# Patient Record
Sex: Male | Born: 1960 | Race: Black or African American | Hispanic: No | Marital: Single | Smoking: Former smoker
Health system: Southern US, Community
[De-identification: ages and names within clinical notes are randomized; demographics above are authoritative.]

## PROBLEM LIST (undated history)

## (undated) DIAGNOSIS — D649 Anemia, unspecified: Secondary | ICD-10-CM

---

## 2009-01-31 ENCOUNTER — Emergency Department: Payer: Self-pay | Admitting: Emergency Medicine

## 2011-07-21 ENCOUNTER — Emergency Department: Payer: Self-pay | Admitting: Emergency Medicine

## 2015-01-02 ENCOUNTER — Observation Stay: Payer: Self-pay | Admitting: Internal Medicine

## 2015-01-02 DIAGNOSIS — R079 Chest pain, unspecified: Secondary | ICD-10-CM

## 2015-02-11 NOTE — H&P (Signed)
PATIENT NAME:  Clinton Phillips, Clinton Phillips MR#:  [id number] DATE OF BIRTH:  02-May-1961  DATE OF ADMISSION:  01/02/2015  PRIMARY CARE PHYSICIAN:  None.   CHIEF COMPLAINT:  Chest pain.   HISTORY OF PRESENTING ILLNESS:  This is a 54 year old African-American male patient with history of cocaine, marijuana, and tobacco abuse, who presents to the Emergency Room complaining of chest pain for 1 day. The patient mentions that initially this pain happened acutely at rest. It lasted about 4 to 5 hours and resolved. It does tend to get worse when he takes a deep breath or coughs. Again, it happened today and presented to the Emergency Room. Here, initial EKG showed possible ST elevation in V1, when he was thought to be a candidate for [facility], but repeat EKG immediately showed that this had normalized.   He does not complain of any shortness of breath. No nausea, vomiting, sweating, abdominal pain, or diarrhea. He mentions that he does not use much cocaine, but sometimes uses marijuana laced with cocaine, which he used a week prior. He is not on any home medications.   PAST MEDICAL HISTORY: Cocaine, marijuana and tobacco abuse.   FAMILY HISTORY:  Diabetes. No premature CAD in the family.   SOCIAL HISTORY:  The patient smokes a pack a day. Drinks 1 to 2 beers every other day. Uses marijuana and cocaine. Works as a Gafferhandyman.   CODE STATUS:  Full code.   ALLERGIES:  No known drug allergies.   HOME MEDICATIONS:  None.   REVIEW OF SYSTEMS: CONSTITUTIONAL:  No fever or fatigue.  EYES:  No blurred vision, pain, or redness.  EARS, NOSE, AND THROAT:  No tinnitus, ear pain, or hearing loss.  RESPIRATORY:  No cough, wheeze, or hemoptysis.  CARDIOVASCULAR:  Has chest pain. No orthopnea or edema.  GASTROINTESTINAL:  No nausea, vomiting, diarrhea, or abdominal pain.  GENITOURINARY:  No dysuria, hematuria, or frequency.  ENDOCRINE:  No polyuria, nocturia, or thyroid problems.  HEMATOLOGIC AND  LYMPHATIC:  No anemia or easy bruising or bleeding.  INTEGUMENTARY:  No acne, rash, or lesion.  MUSCULOSKELETAL:  No back pain or arthritis.  NEUROLOGIC:  No focal numbness or weakness.  PSYCHIATRIC:  No anxiety or depression.   PHYSICAL EXAMINATION: VITAL SIGNS:  Temperature 98, pulse 68, blood pressure 136/90, saturating 97% on room air.  GENERAL:  Moderately-built, African-American male patient lying in bed, overall seems comfortable.  PSYCHIATRIC:  Alert and oriented x 3. Mood and affect are appropriate. Judgment is intact.  HEENT:  Atraumatic, normocephalic. Oral mucosa is moist and pink. External ears and nose are normal. No pallor. No icterus. Pupils are bilaterally equal and reactive to light.  NECK:  Supple. No thyromegaly. No palpable lymph nodes. Trachea is midline. No carotid bruits or JVD.  CARDIOVASCULAR:  S1 and S2 without any murmurs. Peripheral pulses are 2+. No edema.  RESPIRATORY:  Normal work of breathing. Clear to auscultation on both sides.  GASTROINTESTINAL:  Soft abdomen, nontender. Bowel sounds present. No organomegaly palpable.  SKIN:  Warm and dry. No petechiae, rash, or ulcers.  MUSCULOSKELETAL:  No joint swelling, redness, or effusion of the large joints. Normal muscle tone. NEUROLOGICAL:  Motor strength is 5/5 in upper and lower extremities. Sensation to fine touch is intact all over.  LYMPHATIC:  No cervical lymphadenopathy.   LABORATORY DATA:  Show glucose of 76, BUN 14, creatinine 1, sodium 139, and potassium of 3.7. AST, ALT, alkaline phosphatase, and bilirubin are normal. Troponin is  less than 0.03. WBC is 6000, hemoglobin 13.2, and platelets are 212,000 with urinalysis showing no bacteria.   Urine drug screen is positive for cannabinoids and opiates.   EKG shows LVH with repolarization change. Initially, EKG had vortex calibration at 20 mm/mV. Repeat EKG showed normalization of his ST elevation in V1, but the vortex calibration was 10 mm/mV.   Chest x-ray  shows nothing acute.   ASSESSMENT AND PLAN: 1.  Chest pain, likely musculoskeletal and pleuritic, but considering the EKG changes, we will admit the patient as requested by the ER physician. We will get 2 more sets of cardiac enzymes. We will schedule for a stress test. We will give the patient nitroglycerin p.r.n. and start him on aspirin. No beta blocker, as he does use cocaine. We will add ibuprofen and Toradol IV in case he has any further pain.  2.  Tobacco abuse. Counseled the patient to quit smoking > 3 min 3.  Deep vein thrombosis prophylaxis with Lovenox.   CODE STATUS:  Full code.   TIME SPENT ON THIS CASE:  40 minutes.   ____________________________ Molinda Bailiff Hollan Philipp, MD srs:nb D: 01/02/2015 02:12:50 ET T: 01/02/2015 02:28:32 ET JOB#: [id number]  cc: Wardell Heath R. Kasiya Burck, MD, <Dictator> Orie Fisherman MD ELECTRONICALLY SIGNED 01/12/2015 13:20

## 2015-02-11 NOTE — Discharge Summary (Signed)
PATIENT NAME:  Clinton Phillips, Clinton Phillips MR#:  [id number] DATE OF BIRTH:  1961-01-07  DATE OF ADMISSION:  01/02/2015 DATE OF DISCHARGE:  01/02/2015  DISCHARGE DIAGNOSES:  Chest pain, probably musculoskeletal; cocaine abuse.   PROCEDURES:  Nuclear stress test on March 22, which was normal.   CODE STATUS:  Full code.   CONSULTATIONS:  Cardiology.   DISCHARGE MEDICATIONS:  Tylenol 325 mg 2 tablets p.o. every 4 hours as needed for pain, aspirin 81 mg enteric-coated p.o. once daily, ibuprofen 800 mg 1 tablet p.o. every 8 hours as needed for moderate-to-severe pain with food.   Recommended to stop using cocaine.   DIET:  Regular.   ACTIVITY:  As tolerated.   FOLLOWUP:  With primary care physician in 1 week.   BRIEF HISTORY AND PHYSICAL:  The patient is a 54 year old African-American male with history of cocaine, marijuana, and tobacco abuse, who came in to the ED with a chief complaint of chest pain. Please review history and physical for details. Initial EKG had possible ST elevation in V1. ER physician thought of transferring the patient to [facility], but immediate repeat EKG has revealed a normal EKG. Please review history and physical for details.   HOSPITAL COURSE BASED ON PROBLEM:  Chest pain, most likely musculoskeletal as it is reproducible, but considering the EKG changes, the patient was admitted to the hospital for overnight observation. Two more sets of cardiac enzymes were done, and acute MI was ruled out. The patient had nuclear stress test done, which was evidently normal. Beta blocker was not given, as the patient uses cocaine. The patient was provided with aspirin, and nitroglycerin was ordered for as-needed basis for his chest pain. Ibuprofen and Toradol IV was added to his regimen, as it was most likely musculoskeletal chest pain. On further examination, his chest pain was reproducible, which has resolved with ibuprofen. Stress test being normal, cardiology has recommended  discharging the patient from cardiology standpoint.   The patient was recommended to stop using illicit drugs, cocaine and marijuana. Counseled to quit smoking and counseling was provided for 3 to 5 minutes. The patient was recommended to use nicotine patches, which are over-the-counter. The patient was discharged under satisfactory condition.   PHYSICAL EXAMINATION: VITAL SIGNS:  Temperature 97.9, pulse 64, respirations 20, blood pressure 129/80, pulse oximetry 96% on 2 liters.  GENERAL APPEARANCE:  Not in acute distress. Moderately built and nourished.  HEENT:  Normocephalic, atraumatic. Pupils are equal and reactive to light and accommodation. No scleral icterus. No conjunctival injection. No sinus tenderness. No postnasal drip. Moist mucous membranes.  NECK:  Supple. No lymphadenopathy. No thyromegaly. No masses.  LUNGS:  Clear to auscultation bilaterally. No accessory muscle use. Positive anterior chest wall tenderness on palpation on the left side of the chest, which is reproducible.  CARDIAC:  S1 and S2, normal. Regular rate and rhythm. No murmurs.  GASTROINTESTINAL:  Soft. Bowel sounds are positive in all 4 quadrants. Nontender, nondistended. No masses.  NEUROLOGIC:  Awake, alert, and oriented x 3. Cranial nerves II through XII are grossly intact. Motor and sensory are intact. Reflexes are 2+.  EXTREMITIES:  No edema. No cyanosis. No clubbing.  SKIN:  Warm to touch. Normal turgor. No rashes. No lesions.   LABORATORY AND IMAGING STUDIES:  Troponin less than 0.03 x 3. CK and CPK-MB were normal. Urine drug screen was positive for cannabinoids and opiates. WBC is 6.0, hemoglobin 13.2, hematocrit 39.6, platelets 212,000. UA:  Nitrite and leukocyte esterase are negative. BMP  is normal. LFTs are normal. Nuclear stress test performed on March 22 by cardiology.  Pharmacological myocardial perfusion study with no significant ischemia, estimated EF of 67%. Left ventricular global function was normal. No  significant wall motion abnormalities. Overall, low-risk scan.   The patient was discharged home under satisfactory condition. All of his questions were answered.   TOTAL TIME SPENT ON DISCHARGE AND COORDINATION OF CARE:  45 minutes.   ____________________________ Ramonita LabAruna Lusia Greis, MD ag:nb D: 01/03/2015 15:43:08 ET T: 01/04/2015 05:07:49 ET JOB#: [id number]  cc: Ramonita LabAruna Emmitt Matthews, MD, <Dictator> Antonieta Ibaimothy J. Gollan, MD Primary care physician   Ramonita LabARUNA Chava Dulac MD ELECTRONICALLY SIGNED 01/12/2015 13:16

## 2016-11-02 ENCOUNTER — Emergency Department: Payer: No Typology Code available for payment source

## 2016-11-02 ENCOUNTER — Emergency Department
Admission: EM | Admit: 2016-11-02 | Discharge: 2016-11-02 | Disposition: A | Discharge status: Home / Self Care | Payer: No Typology Code available for payment source | Attending: Student in an Organized Health Care Education/Training Program | Admitting: Student in an Organized Health Care Education/Training Program

## 2016-11-02 ENCOUNTER — Encounter: Payer: Self-pay | Admitting: Emergency Medicine

## 2016-11-02 DIAGNOSIS — Y999 Unspecified external cause status: Secondary | ICD-10-CM | POA: Insufficient documentation

## 2016-11-02 DIAGNOSIS — S29012A Strain of muscle and tendon of back wall of thorax, initial encounter: Secondary | ICD-10-CM | POA: Diagnosis not present

## 2016-11-02 DIAGNOSIS — Y939 Activity, unspecified: Secondary | ICD-10-CM | POA: Insufficient documentation

## 2016-11-02 DIAGNOSIS — Y92410 Unspecified street and highway as the place of occurrence of the external cause: Secondary | ICD-10-CM | POA: Insufficient documentation

## 2016-11-02 DIAGNOSIS — F17210 Nicotine dependence, cigarettes, uncomplicated: Secondary | ICD-10-CM | POA: Diagnosis not present

## 2016-11-02 DIAGNOSIS — S299XXA Unspecified injury of thorax, initial encounter: Secondary | ICD-10-CM | POA: Diagnosis present

## 2016-11-02 DIAGNOSIS — T148XXA Other injury of unspecified body region, initial encounter: Secondary | ICD-10-CM

## 2016-11-02 HISTORY — DX: Anemia, unspecified: D64.9

## 2016-11-02 MED ORDER — KETOROLAC TROMETHAMINE 60 MG/2ML IM SOLN
60.0000 mg | Freq: Once | INTRAMUSCULAR | Status: AC
  Administered 2016-11-02: 60 mg via INTRAMUSCULAR
  Filled 2016-11-02: qty 2

## 2016-11-02 MED ORDER — MELOXICAM 15 MG PO TABS: 15.0000 mg | ORAL_TABLET | Freq: Every day | ORAL | 0 refills | Status: AC

## 2016-11-02 MED ORDER — LIDOCAINE 5 % EX PTCH: 1.0000 | MEDICATED_PATCH | CUTANEOUS | 0 refills | Status: DC
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
## 2017-08-05 ENCOUNTER — Emergency Department: Payer: No Typology Code available for payment source

## 2017-08-05 ENCOUNTER — Emergency Department
Admission: EM | Admit: 2017-08-05 | Discharge: 2017-08-05 | Disposition: A | Discharge status: Home / Self Care | Payer: No Typology Code available for payment source | Attending: Emergency Medicine | Admitting: Emergency Medicine

## 2017-08-05 DIAGNOSIS — M7918 Myalgia, other site: Secondary | ICD-10-CM | POA: Insufficient documentation

## 2017-08-05 DIAGNOSIS — F17210 Nicotine dependence, cigarettes, uncomplicated: Secondary | ICD-10-CM | POA: Diagnosis not present

## 2017-08-05 DIAGNOSIS — R0789 Other chest pain: Secondary | ICD-10-CM | POA: Diagnosis not present

## 2017-08-05 MED ORDER — KETOROLAC TROMETHAMINE 30 MG/ML IJ SOLN
30.0000 mg | Freq: Once | INTRAMUSCULAR | Status: AC
  Administered 2017-08-05: 30 mg via INTRAMUSCULAR
  Filled 2017-08-05: qty 1

## 2017-08-05 MED ORDER — CYCLOBENZAPRINE HCL 5 MG PO TABS: 5.0000 mg | ORAL_TABLET | Freq: Three times a day (TID) | ORAL | 0 refills | Status: DC | PRN

## 2017-08-05 MED ORDER — IBUPROFEN 800 MG PO TABS: 800.0000 mg | ORAL_TABLET | Freq: Three times a day (TID) | ORAL | 0 refills | Status: DC | PRN
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
## 2017-08-05 NOTE — Discharge Instructions (Signed)
Please ice just 20 minutes every hour, take anti-inflammatory medications and muscle relaxers as needed.  Avoid any heavy lifting pushing or pulling for the next 24-48 hours.  Follow-up with orthopedics if no improvement in 1 week.
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
## 2017-08-05 NOTE — ED Notes (Addendum)
Dr. Don PerkingVeronese states no blood work needed at this time.

## 2017-08-06 ENCOUNTER — Other Ambulatory Visit: Payer: Self-pay

## 2017-08-08 ENCOUNTER — Emergency Department
Admission: EM | Admit: 2017-08-08 | Discharge: 2017-08-08 | Disposition: A | Discharge status: Home / Self Care | Payer: No Typology Code available for payment source | Attending: Emergency Medicine | Admitting: Emergency Medicine

## 2017-08-08 ENCOUNTER — Encounter: Payer: Self-pay | Admitting: Emergency Medicine

## 2017-08-08 ENCOUNTER — Emergency Department: Payer: No Typology Code available for payment source

## 2017-08-08 DIAGNOSIS — F17210 Nicotine dependence, cigarettes, uncomplicated: Secondary | ICD-10-CM | POA: Insufficient documentation

## 2017-08-08 DIAGNOSIS — M546 Pain in thoracic spine: Secondary | ICD-10-CM | POA: Insufficient documentation

## 2017-08-08 DIAGNOSIS — S299XXD Unspecified injury of thorax, subsequent encounter: Secondary | ICD-10-CM | POA: Diagnosis present

## 2017-08-08 MED ORDER — KETOROLAC TROMETHAMINE 30 MG/ML IJ SOLN
30.0000 mg | Freq: Once | INTRAMUSCULAR | Status: AC
  Administered 2017-08-08: 30 mg via INTRAMUSCULAR
  Filled 2017-08-08: qty 1

## 2017-08-08 MED ORDER — MELOXICAM 15 MG PO TABS: 15.0000 mg | ORAL_TABLET | Freq: Every day | ORAL | 2 refills | Status: AC
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
## 2017-08-08 NOTE — Discharge Instructions (Signed)
Follow [facility] with [facility] if any continued problems  Ice or heat to back as needed Stop ibuprofen.  Start mobic today and take one a day with food

## 2017-08-08 NOTE — ED Notes (Signed)
Pt able to ambulate at time of discharge. NAD noted at this time.
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
## 2020-08-15 ENCOUNTER — Emergency Department
Admission: EM | Admit: 2020-08-15 | Discharge: 2020-08-15 | Disposition: A | Discharge status: Home / Self Care | Payer: Self-pay | Attending: Emergency Medicine | Admitting: Emergency Medicine

## 2020-08-15 ENCOUNTER — Other Ambulatory Visit: Payer: Self-pay

## 2020-08-15 ENCOUNTER — Encounter: Payer: Self-pay | Admitting: Emergency Medicine

## 2020-08-15 DIAGNOSIS — L03213 Periorbital cellulitis: Secondary | ICD-10-CM | POA: Insufficient documentation

## 2020-08-15 DIAGNOSIS — F17210 Nicotine dependence, cigarettes, uncomplicated: Secondary | ICD-10-CM | POA: Insufficient documentation

## 2020-08-15 MED ORDER — AMOXICILLIN-POT CLAVULANATE 875-125 MG PO TABS: 1.0000 | ORAL_TABLET | Freq: Two times a day (BID) | ORAL | 0 refills | Status: AC

## 2020-08-15 NOTE — Discharge Instructions (Addendum)
Take Augmentin twice daily for the next ten days.  Use warm compresses four times daily for the next seven days.  Please follow up with Ophthalmology if symptoms do not improve.

## 2020-08-15 NOTE — ED Triage Notes (Signed)
Patient states that his right eye has been bothering him times one week. Patient states that he thinks that he got something in it. Patient states that the discomfort has become worse.

## 2020-08-15 NOTE — ED Notes (Signed)
Pt has swelling to right eye that started a few days ago. Pt states having slight pain when he touches it or looks a certain way. Pt denies fevers or any insect bites.
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
## 2021-01-05 ENCOUNTER — Emergency Department
Admission: EM | Admit: 2021-01-05 | Discharge: 2021-01-05 | Disposition: A | Discharge status: Home / Self Care | Payer: Self-pay | Attending: Emergency Medicine | Admitting: Emergency Medicine

## 2021-01-05 ENCOUNTER — Emergency Department: Payer: Self-pay

## 2021-01-05 ENCOUNTER — Other Ambulatory Visit: Payer: Self-pay

## 2021-01-05 DIAGNOSIS — R112 Nausea with vomiting, unspecified: Secondary | ICD-10-CM | POA: Insufficient documentation

## 2021-01-05 DIAGNOSIS — Q613 Polycystic kidney, unspecified: Secondary | ICD-10-CM

## 2021-01-05 DIAGNOSIS — F17210 Nicotine dependence, cigarettes, uncomplicated: Secondary | ICD-10-CM | POA: Insufficient documentation

## 2021-01-05 DIAGNOSIS — R310 Gross hematuria: Secondary | ICD-10-CM

## 2021-01-05 LAB — URINALYSIS, COMPLETE (UACMP) WITH MICROSCOPIC
Bacteria, UA: NONE SEEN
Bilirubin Urine: NEGATIVE
Glucose, UA: NEGATIVE mg/dL
Ketones, ur: NEGATIVE mg/dL
Leukocytes,Ua: NEGATIVE
Nitrite: NEGATIVE
Protein, ur: 30 mg/dL — AB
RBC / HPF: >50 RBC/hpf — ABNORMAL HIGH (ref 0–5)
Specific Gravity, Urine: 1.021 (ref 1.005–1.030)
Squamous Epithelial / LPF: NONE SEEN (ref 0–5)
WBC, UA: NONE SEEN WBC/hpf (ref 0–5)
pH: 5 (ref 5.0–8.0)

## 2021-01-05 LAB — CBC
HCT: 39.3 % (ref 39.0–52.0)
Hemoglobin: 13.5 g/dL (ref 13.0–17.0)
MCH: 33.3 pg (ref 26.0–34.0)
MCHC: 34.4 g/dL (ref 30.0–36.0)
MCV: 97 fL (ref 80.0–100.0)
Platelets: 243 10*3/uL (ref 150–400)
RBC: 4.05 MIL/uL — ABNORMAL LOW (ref 4.22–5.81)
RDW: 12.5 % (ref 11.5–15.5)
WBC: 4.5 10*3/uL (ref 4.0–10.5)
nRBC: 0 % (ref 0.0–0.2)

## 2021-01-05 LAB — BASIC METABOLIC PANEL
Anion gap: 7 (ref 5–15)
BUN: 13 mg/dL (ref 8–23)
CO2: 27 mmol/L (ref 22–32)
Calcium: 9 mg/dL (ref 8.9–10.3)
Chloride: 105 mmol/L (ref 98–111)
Creatinine, Ser: 1.23 mg/dL (ref 0.61–1.24)
GFR, Estimated: >60 mL/min (ref >60)
Glucose, Bld: 114 mg/dL — ABNORMAL HIGH (ref 70–99)
Potassium: 4 mmol/L (ref 3.5–5.1)
Sodium: 139 mmol/L (ref 135–145)

## 2021-01-05 MED ORDER — MORPHINE SULFATE (PF) 4 MG/ML IV SOLN
6.0000 mg | Freq: Once | INTRAVENOUS | Status: AC
  Administered 2021-01-05: 6 mg via INTRAVENOUS
  Filled 2021-01-05: qty 2

## 2021-01-05 MED ORDER — ONDANSETRON 4 MG PO TBDP: 4.0000 mg | ORAL_TABLET | Freq: Three times a day (TID) | ORAL | 0 refills | Status: DC | PRN

## 2021-01-05 MED ORDER — ONDANSETRON HCL 4 MG/2ML IJ SOLN
4.0000 mg | Freq: Once | INTRAMUSCULAR | Status: AC
  Administered 2021-01-05: 4 mg via INTRAVENOUS
  Filled 2021-01-05: qty 2

## 2021-01-05 MED ORDER — KETOROLAC TROMETHAMINE 30 MG/ML IJ SOLN
30.0000 mg | Freq: Once | INTRAMUSCULAR | Status: AC
  Administered 2021-01-05: 30 mg via INTRAVENOUS
  Filled 2021-01-05: qty 1

## 2021-01-05 MED ORDER — LACTATED RINGERS IV BOLUS
1000.0000 mL | Freq: Once | INTRAVENOUS | Status: AC
  Administered 2021-01-05: 1000 mL via INTRAVENOUS

## 2021-01-05 MED ORDER — OXYCODONE-ACETAMINOPHEN 5-325 MG PO TABS: 1.0000 | ORAL_TABLET | Freq: Four times a day (QID) | ORAL | 0 refills | Status: AC | PRN

## 2021-01-05 NOTE — ED Triage Notes (Signed)
Pt comes pov with right sided flank pain. Family hx of stones. Pain started today. Pt states some hematuria.

## 2021-01-05 NOTE — Discharge Instructions (Signed)
It is very important that you follow-up with a regular doctor for following up your kidney disease and findings on CT.  It is probably easiest to [facility] or [facility].  I provided these numbers.  Call to set up an appointment within the next several weeks.  Avoid taking ibuprofen, Aleve, or other medications that could harm your kidneys.  Drink at least 6 to 8 glasses of water daily.

## 2021-01-05 NOTE — ED Provider Notes (Signed)
[facility] Emergency Department Provider Note  ____________________________________________   Event Date/Time   First MD Initiated Contact with Patient 01/05/21 1815     (approximate)  I have reviewed the triage vital signs and the nursing notes.   HISTORY  Chief Complaint Flank Pain    HPI Clinton HuskJoseph Phillips is a 60 y.o. male  Here with flank pain.  Patient states that his pain began fairly acutely this morning.  He states he was in his usual state of health yesterday, until he developed acute onset of severe right flank pain.  He describes the pain as aching, gnawing, radiating down towards his right lower quadrant.  He had associated grossly bloody urine this morning.  He states he then passed a small amount of sediment when he peed again.  He did not have any blood in his most recent urination, but he continues to have 6 out of 10 aching pain.  The pain is intermittently worse randomly.  It does seem to improve with some position changes.  No specific alleviating factors.  No fevers or chills.  He has had nausea and 2 episodes of emesis.  No other complaints.  No personal history of kidney stones, but does have a family history.       Past Medical History:  Diagnosis Date  . Anemia     There are no problems to display for this patient.   History reviewed. No pertinent surgical history.  Prior to Admission medications   Medication Sig Start Date End Date Taking? Authorizing Provider  ondansetron (ZOFRAN ODT) 4 MG disintegrating tablet Take 1 tablet (4 mg total) by mouth every 8 (eight) hours as needed for nausea or vomiting. 01/05/21  Yes Shaune PollackIsaacs, Shanedra Lave, MD  oxyCODONE-acetaminophen (PERCOCET) 5-325 MG tablet Take 1-2 tablets by mouth every 6 (six) hours as needed for moderate pain or severe pain (no more than 6 tabs daily). 01/05/21 01/05/22 Yes Shaune PollackIsaacs, Kristianna Saperstein, MD  cyclobenzaprine (FLEXERIL) 5 MG tablet Take 1-2 tablets (5-10 mg total) by mouth 3  (three) times daily as needed for muscle spasms. 08/05/17   Evon SlackGaines, Thomas C, PA-C    Allergies Patient has no known allergies.  History reviewed. No pertinent family history.  Social History Social History   Tobacco Use  . Smoking status: Current Every Day Smoker    Packs/day: 0.50    Types: Cigarettes  . Smokeless tobacco: Never Used  Substance Use Topics  . Alcohol use: Yes    Comment: occasionally  . Drug use: Yes    Types: Marijuana    Comment: occ    Review of Systems  Review of Systems  Constitutional: Positive for fatigue. Negative for chills and fever.  HENT: Negative for sore throat.   Respiratory: Negative for shortness of breath.   Cardiovascular: Negative for chest pain.  Gastrointestinal: Positive for nausea and vomiting. Negative for abdominal pain.  Genitourinary: Positive for frequency and hematuria. Negative for flank pain.  Musculoskeletal: Negative for neck pain.  Skin: Negative for rash and wound.  Allergic/Immunologic: Negative for immunocompromised state.  Neurological: Negative for weakness and numbness.  Hematological: Does not bruise/bleed easily.  All other systems reviewed and are negative.    ____________________________________________  PHYSICAL EXAM:      VITAL SIGNS: ED Triage Vitals  Enc Vitals Group     BP 01/05/21 1747 (!) 148/93     Pulse Rate 01/05/21 1747 80     Resp 01/05/21 1747 18     Temp 01/05/21 1747  98.3 F (36.8 C)     Temp Source 01/05/21 1747 Oral     SpO2 01/05/21 1747 96 %     Weight 01/05/21 1728 205 lb (93 kg)     Height 01/05/21 1728 6\' 1"  (1.854 m)     Head Circumference --      Peak Flow --      Pain Score 01/05/21 1727 8     Pain Loc --      Pain Edu? --      Excl. in GC? --      Physical Exam Vitals and nursing note reviewed.  Constitutional:      General: He is not in acute distress.    Appearance: He is well-developed.  HENT:     Head: Normocephalic and atraumatic.  Eyes:      Conjunctiva/sclera: Conjunctivae normal.  Cardiovascular:     Rate and Rhythm: Normal rate and regular rhythm.     Heart sounds: Normal heart sounds. No murmur heard. No friction rub.  Pulmonary:     Effort: Pulmonary effort is normal. No respiratory distress.     Breath sounds: Normal breath sounds. No wheezing or rales.  Abdominal:     General: There is no distension.     Palpations: Abdomen is soft.     Tenderness: There is no abdominal tenderness.  Musculoskeletal:     Cervical back: Neck supple.  Skin:    General: Skin is warm.     Capillary Refill: Capillary refill takes less than 2 seconds.  Neurological:     Mental Status: He is alert and oriented to person, place, and time.     Motor: No abnormal muscle tone.       ____________________________________________   LABS (all labs ordered are listed, but only abnormal results are displayed)  Labs Reviewed  URINALYSIS, COMPLETE (UACMP) WITH MICROSCOPIC - Abnormal; Notable for the following components:      Result Value   Color, Urine AMBER (*)    APPearance CLOUDY (*)    Hgb urine dipstick LARGE (*)    Protein, ur 30 (*)    RBC / HPF >50 (*)    All other components within normal limits  BASIC METABOLIC PANEL - Abnormal; Notable for the following components:   Glucose, Bld 114 (*)    All other components within normal limits  CBC - Abnormal; Notable for the following components:   RBC 4.05 (*)    All other components within normal limits    ____________________________________________  EKG:  ________________________________________  RADIOLOGY All imaging, including plain films, CT scans, and ultrasounds, independently reviewed by me, and interpretations confirmed via formal radiology reads.  ED MD interpretation:   CT stone: Polycystic kidney disease noted, possible liver cyst, no obstructing stone  Official radiology report(s): CT Renal Stone Study  Result Date: 01/05/2021 CLINICAL DATA:  Bilateral flank  pain. EXAM: CT ABDOMEN AND PELVIS WITHOUT CONTRAST TECHNIQUE: Multidetector CT imaging of the abdomen and pelvis was performed following the standard protocol without IV contrast. COMPARISON:  None. FINDINGS: Lower chest: Dependent atelectasis in both lung bases. No pleural fluid. Heart is normal in size. Hepatobiliary: 3.5 cm homogeneous complex cystic lesion abuts the anterior left lobe of the liver just to the left of midline in the upper abdomen, unclear if this is an exophytic hepatic cyst or extrahepatic lesion. There is no other focal hepatic lesion. Gallbladder physiologically distended, no calcified stone. No biliary dilatation. Pancreas: No ductal dilatation or inflammation. Spleen: Normal in  size without focal abnormality. Adrenals/Urinary Tract: No adrenal nodule. Polycystic disease of both kidneys with near complete renal parenchymal replacement with innumerable cysts. Many of these cysts are hyperdense or complex. This includes multiple adjacent cyst versus complex cyst in the lower right kidney that has mild contour deformity, measuring approximately 6.8 cm transverse. There is a thick-walled calcified lesion in the anterior upper right kidney measuring 2.1 cm. No discrete renal calculi. There is no hydronephrosis. No ureteral stone. Urinary bladder is near completely empty. No bladder stone. No urethral stone is visualized. Stomach/Bowel: Ingested material distends the stomach. There is a 3.5 cm homogeneous complex cystic lesion abutting the greater curvature of the stomach as well as the anterior left lobe of the liver, etiology indeterminate. No underlying mass effect no small bowel obstruction or inflammation. Normal air-filled appendix courses into the central mid abdomen. Mild descending and sigmoid colonic diverticulosis. No evidence of diverticulitis. Vascular/Lymphatic: Aortic atherosclerosis. Bi-iliac atherosclerosis and tortuosity. There are few prominent bilateral inguinal nodes, largest  on the left measuring 9 mm. Reproductive: Prostate is unremarkable. Other: No free air, free fluid, or intra-abdominal fluid collection. Musculoskeletal: Lower lumbar facet arthropathy. There are no acute or suspicious osseous abnormalities. IMPRESSION: 1. No renal stones or obstructive uropathy. 2. Polycystic disease of both kidneys with near complete renal parenchymal replacement with innumerable cysts. Many of these cysts are hyperdense or complex. This includes multiple adjacent cysts versus complex cyst in the lower right kidney that has mild contour deformity. There are no prior exams to assess for stability. Recommend further evaluation with renal protocol MRI on an elective basis. 3. A 3.5 cm homogeneous complex cystic lesion abutting the anterior left lobe of the liver and greater curvature of the stomach is indeterminate. This may represent an exophytic hepatic cyst or an enteric duplication cyst, however is nonspecific. This can also be followed on MRI. 4. Mild colonic diverticulosis without diverticulitis. Aortic Atherosclerosis (ICD10-I70.0). Electronically Signed   By: Narda Rutherford M.D.   On: 01/05/2021 20:16    ____________________________________________  PROCEDURES   Procedure(s) performed (including Critical Care):  Procedures  ____________________________________________  INITIAL IMPRESSION / MDM / ASSESSMENT AND PLAN / ED COURSE  As part of my medical decision making, I reviewed the following data within the electronic MEDICAL RECORD NUMBER Nursing notes reviewed and incorporated, Old chart reviewed, Notes from prior ED visits, and [place] Controlled Substance Database       *Clinton Phillips was evaluated in Emergency Department on 01/05/2021 for the symptoms described in the history of present illness. He was evaluated in the context of the global COVID-19 pandemic, which necessitated consideration that the patient might be at risk for infection with the SARS-CoV-2 virus that  causes COVID-19. Institutional protocols and algorithms that pertain to the evaluation of patients at risk for COVID-19 are in a state of rapid change based on information released by regulatory bodies including the CDC and federal and state organizations. These policies and algorithms were followed during the patient's care in the ED.  Some ED evaluations and interventions may be delayed as a result of limited staffing during the pandemic.*     Medical Decision Making: Very pleasant 60 year old male here with right flank pain.  This is essentially resolved after passage of what sounds like a possible stone.  He does have hematuria noted on UA.  Renal function is normal.  No evidence of pyuria, fever, leukocytosis, or signs of superimposed infection.  Of note, on his CT stone, he does have what  appears to be polycystic kidney and possible liver disease.  Creatinine is 1.2.  Patient does have a family history of kidney disease although he denies known history of polycystic disease.  I discussed the CT findings at length with him, as well as need for outpatient follow-up for monitoring of his kidney function as well as further evaluation.  Discussed that failure to do so could result in worsening liver and kidney disease with subsequent renal failure.  He understands.  Will provide him with both primary as well as nephrology follow-up.  Return precautions given.  Advised him to avoid NSAIDs as an outpatient.  ____________________________________________  FINAL CLINICAL IMPRESSION(S) / ED DIAGNOSES  Final diagnoses:  Gross hematuria  Polycystic kidney disease     MEDICATIONS GIVEN DURING THIS VISIT:  Medications  lactated ringers bolus 1,000 mL (0 mLs Intravenous Stopped 01/05/21 2002)  morphine 4 MG/ML injection 6 mg (6 mg Intravenous Given 01/05/21 1857)  ondansetron (ZOFRAN) injection 4 mg (4 mg Intravenous Given 01/05/21 1848)  ketorolac (TORADOL) 30 MG/ML injection 30 mg (30 mg Intravenous Given  01/05/21 1849)     ED Discharge Orders         Ordered    oxyCODONE-acetaminophen (PERCOCET) 5-325 MG tablet  Every 6 hours PRN        01/05/21 2058    ondansetron (ZOFRAN ODT) 4 MG disintegrating tablet  Every 8 hours PRN        01/05/21 2058           Note:  This document was prepared using Dragon voice recognition software and may include unintentional dictation errors.   Shaune Pollack, MD 01/05/21 2152

## 2021-01-10 ENCOUNTER — Telehealth: Payer: Self-pay

## 2021-01-10 NOTE — Telephone Encounter (Signed)
Received ED referral and called pt. He is not interested in becoming a pt here at the moment.

## 2021-07-20 ENCOUNTER — Emergency Department
Admission: EM | Admit: 2021-07-20 | Discharge: 2021-07-20 | Disposition: A | Discharge status: Home / Self Care | Payer: Self-pay | Attending: Emergency Medicine | Admitting: Emergency Medicine

## 2021-07-20 DIAGNOSIS — M25519 Pain in unspecified shoulder: Secondary | ICD-10-CM | POA: Insufficient documentation

## 2021-07-20 DIAGNOSIS — Z5321 Procedure and treatment not carried out due to patient leaving prior to being seen by health care provider: Secondary | ICD-10-CM | POA: Insufficient documentation

## 2021-07-20 NOTE — ED Notes (Signed)
After vitals, patient asked to go get his phone out of his truck. Patient seen getting in his truck and driving away

## 2021-12-18 ENCOUNTER — Other Ambulatory Visit: Payer: Self-pay

## 2021-12-18 ENCOUNTER — Encounter: Payer: Self-pay | Admitting: Nurse Practitioner

## 2021-12-18 ENCOUNTER — Ambulatory Visit: Payer: Self-pay | Admitting: Nurse Practitioner

## 2021-12-18 DIAGNOSIS — Z113 Encounter for screening for infections with a predominantly sexual mode of transmission: Secondary | ICD-10-CM

## 2021-12-18 DIAGNOSIS — A599 Trichomoniasis, unspecified: Secondary | ICD-10-CM

## 2021-12-18 LAB — HM HIV SCREENING LAB: HM HIV Screening: NEGATIVE

## 2021-12-18 LAB — GRAM STAIN

## 2021-12-18 LAB — HEPATITIS B SURFACE ANTIGEN: Hepatitis B Surface Ag: NONREACTIVE

## 2021-12-18 LAB — HM HEPATITIS C SCREENING LAB: HM Hepatitis Screen: NEGATIVE

## 2021-12-18 MED ORDER — METRONIDAZOLE 500 MG PO TABS: 500.0000 mg | ORAL_TABLET | Freq: Two times a day (BID) | ORAL | 0 refills | Status: AC

## 2021-12-18 NOTE — Progress Notes (Signed)
Patient seen today for STD testing. Gram stain reviewed. Medication dispensed. Providers orders completed.  Patient tx for trich exposure.  ?

## 2021-12-19 NOTE — Progress Notes (Signed)
[facility] Department ?STI clinic/screening visit ? ?Subjective:  ?Clinton Phillips is a 61 y.o. male being seen today for an STI screening visit. The patient reports they do not have symptoms.   ? ?Patient has the following medical conditions:  There are no problems to display for this patient. ? ? ? ?Chief Complaint  ?Patient presents with  ? SEXUALLY TRANSMITTED DISEASE  ?  screening  ? ? ?HPI ? ?Patient reports to clinic today for an STD screening. Patient reports being a contact to [place].   ? ?Does the patient or their partner desires a pregnancy in the next year? No ? ?Screening for MPX risk: ?Does the patient have an unexplained rash? No ?Is the patient MSM? No ?Does the patient endorse multiple sex partners or anonymous sex partners? No ?Did the patient have close or sexual contact with a person diagnosed with MPX? No ?Has the patient traveled outside the [place] where MPX is endemic? No ?Is there a high clinical suspicion for MPX-- evidenced by one of the following No ? -Unlikely to be chickenpox ? -Lymphadenopathy ? -Rash that present in same phase of evolution on any given body part ? ? ?See flowsheet for further details and programmatic requirements.  ? ? ?The following portions of the patient's history were reviewed and updated as appropriate: allergies, current medications, past medical history, past social history, past surgical history and problem list. ? ?Objective:  ?There were no vitals filed for this visit. ? ?Physical Exam ?Constitutional:   ?   Appearance: Normal appearance.  ?HENT:  ?   Head: Normocephalic.  ?   Right Ear: External ear normal.  ?   Left Ear: External ear normal.  ?   Nose: Nose normal.  ?   Mouth/Throat:  ?   Mouth: Mucous membranes are moist.  ?   Comments: Broken teeth noted.  ?Pulmonary:  ?   Effort: Pulmonary effort is normal.  ?Abdominal:  ?   General: Abdomen is flat.  ?   Palpations: Abdomen is soft.  ?Genitourinary: ?   Penis: Circumcised.   ?   Comments: Pubic  area without nits, lice, hair loss, edema, erythema, lesions and inguinal adenopathy. ?Penis without rash, lesions and discharge at meatus. ?Testicles descended bilaterally,nt, no masses or edema.  ?Musculoskeletal:  ?   Cervical back: Full passive range of motion without pain, normal range of motion and neck supple.  ?Skin: ?   General: Skin is warm and dry.  ?Neurological:  ?   Mental Status: He is alert and oriented to person, place, and time.  ?Psychiatric:     ?   Attention and Perception: Attention normal.     ?   Mood and Affect: Mood normal.     ?   Speech: Speech normal.     ?   Behavior: Behavior is cooperative.  ? ? ? ? ?Assessment and Plan:  ?Clinton Phillips is a 61 y.o. male presenting to the [facility] Department for STI screening ? ?1. Screening examination for venereal disease ?-61 year old male in clinic today as a contact to [place].  Desires STD screening.  ?-Patient does not have STI symptoms ?Patient accepted all screenings including  oral GC and bloodwork for HIV/RPR, HBV/HCV ?Patient meets criteria for HepB screening? Yes. Ordered? Yes ?Patient meets criteria for HepC screening? Yes. Ordered? Yes ?Recommended condom use with all sex ?Discussed importance of condom use for STI prevent ? ?Treat gram stain per standing order ?Discussed time line  for [facility] results and that patient will be called with positive results and encouraged patient to call if he had not heard in 2 weeks ?Recommended returning for continued or worsening symptoms.   ? ?- HIV/HCV [place] [facility] ?- Syphilis Serology, [facility] ?- HBV Antigen/[facility] ?- Gram stain ?- Gonococcus culture ?- Gonococcus culture ? ?2. Trichimoniasis ?-Treat Patient today as a contact to [place]. ?- metroNIDAZOLE (FLAGYL) 500 MG tablet; Take 1 tablet (500 mg total) by mouth 2 (two) times daily for 7 days.  Dispense: 14 tablet; Refill: 0 ? ? ? ? ?Return if symptoms worsen or fail to improve. ? ? ? ?Gregary Cromer, FNP ? ?

## 2021-12-22 LAB — GONOCOCCUS CULTURE

## 2023-03-18 ENCOUNTER — Emergency Department
Admission: EM | Admit: 2023-03-18 | Discharge: 2023-03-18 | Disposition: A | Discharge status: Home / Self Care | Payer: Medicaid Other | Attending: Emergency Medicine | Admitting: Emergency Medicine

## 2023-03-18 ENCOUNTER — Other Ambulatory Visit: Payer: Self-pay

## 2023-03-18 DIAGNOSIS — Y92410 Unspecified street and highway as the place of occurrence of the external cause: Secondary | ICD-10-CM | POA: Diagnosis not present

## 2023-03-18 DIAGNOSIS — S199XXA Unspecified injury of neck, initial encounter: Secondary | ICD-10-CM | POA: Diagnosis present

## 2023-03-18 DIAGNOSIS — S3992XA Unspecified injury of lower back, initial encounter: Secondary | ICD-10-CM | POA: Insufficient documentation

## 2023-03-18 DIAGNOSIS — S161XXA Strain of muscle, fascia and tendon at neck level, initial encounter: Secondary | ICD-10-CM | POA: Insufficient documentation

## 2023-03-18 DIAGNOSIS — S39012A Strain of muscle, fascia and tendon of lower back, initial encounter: Secondary | ICD-10-CM

## 2023-03-18 MED ORDER — CYCLOBENZAPRINE HCL 10 MG PO TABS
10.0000 mg | ORAL_TABLET | Freq: Once | ORAL | Status: AC
  Administered 2023-03-18: 10 mg via ORAL
  Filled 2023-03-18: qty 1

## 2023-03-18 MED ORDER — CYCLOBENZAPRINE HCL 5 MG PO TABS: 5.0000 mg | ORAL_TABLET | Freq: Three times a day (TID) | ORAL | 0 refills | Status: DC | PRN

## 2023-03-18 MED ORDER — PREDNISONE 10 MG PO TABS: 10.0000 mg | ORAL_TABLET | Freq: Every day | ORAL | 0 refills | Status: DC

## 2023-03-18 MED ORDER — HYDROCODONE-ACETAMINOPHEN 5-325 MG PO TABS: 1.0000 | ORAL_TABLET | Freq: Four times a day (QID) | ORAL | 0 refills | Status: DC | PRN
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
## 2023-03-18 NOTE — Discharge Instructions (Addendum)
Please apply ice 20 minutes every hour to the areas of soreness over the next couple days.  After 2 days you can transition to heat.  Take med occasions as prescribed as needed for pain.  Return to the ER for any worsening symptoms or any urgent changes in your health.

## 2023-07-25 LAB — AMB RESULTS CONSOLE CBG: Glucose: 123

## 2023-09-01 ENCOUNTER — Encounter: Payer: Self-pay | Admitting: *Deleted

## 2023-09-01 NOTE — Progress Notes (Signed)
Pt attended 07/25/23 screening event where his blood pressure was 154/104 and his blood sugar was 123. At the event, the pt did not name a PCP and di dnot identify any SDOH insecurities. 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 Pt did identify having Medicaid. During the post event phone call, pt stated he did have a PCP and access to medicine so that he could follow up his blood pressure but did not wish to share his PCP's name.  He did say he lived in [place] now. He also stated he appreciated the follow up call but did not wish any additional follow up and did not want to share any additional information about his blood pressure and doctor, repeating "I do have a dr and I am doing ok, thanks." No additional health equity team support scheduled at this time per pt request.

## 2024-09-05 ENCOUNTER — Inpatient Hospital Stay (HOSPITAL_COMMUNITY)

## 2024-09-05 ENCOUNTER — Inpatient Hospital Stay (HOSPITAL_COMMUNITY): Admitting: Anesthesiology

## 2024-09-05 ENCOUNTER — Emergency Department

## 2024-09-05 ENCOUNTER — Inpatient Hospital Stay (HOSPITAL_COMMUNITY)
Admission: EM | Admit: 2024-09-05 | Discharge: 2024-09-19 | DRG: 020 | Disposition: A | Source: Other Acute Inpatient Hospital | Attending: Internal Medicine | Admitting: Internal Medicine

## 2024-09-05 ENCOUNTER — Encounter (HOSPITAL_COMMUNITY)
Admission: EM | Disposition: A | Discharge status: Home / Self Care | Payer: Self-pay | Source: Other Acute Inpatient Hospital

## 2024-09-05 ENCOUNTER — Encounter (HOSPITAL_COMMUNITY): Payer: Self-pay

## 2024-09-05 ENCOUNTER — Emergency Department
Admission: EM | Admit: 2024-09-05 | Discharge: 2024-09-05 | Disposition: A | Discharge status: Home / Self Care | Attending: Emergency Medicine | Admitting: Emergency Medicine

## 2024-09-05 DIAGNOSIS — G9341 Metabolic encephalopathy: Secondary | ICD-10-CM | POA: Diagnosis present

## 2024-09-05 DIAGNOSIS — F17210 Nicotine dependence, cigarettes, uncomplicated: Secondary | ICD-10-CM | POA: Diagnosis present

## 2024-09-05 DIAGNOSIS — I671 Cerebral aneurysm, nonruptured: Secondary | ICD-10-CM | POA: Diagnosis not present

## 2024-09-05 DIAGNOSIS — R4182 Altered mental status, unspecified: Secondary | ICD-10-CM | POA: Diagnosis present

## 2024-09-05 DIAGNOSIS — F17200 Nicotine dependence, unspecified, uncomplicated: Secondary | ICD-10-CM | POA: Diagnosis not present

## 2024-09-05 DIAGNOSIS — E860 Dehydration: Secondary | ICD-10-CM | POA: Diagnosis present

## 2024-09-05 DIAGNOSIS — D649 Anemia, unspecified: Secondary | ICD-10-CM | POA: Diagnosis not present

## 2024-09-05 DIAGNOSIS — I615 Nontraumatic intracerebral hemorrhage, intraventricular: Secondary | ICD-10-CM | POA: Diagnosis present

## 2024-09-05 DIAGNOSIS — I609 Nontraumatic subarachnoid hemorrhage, unspecified: Secondary | ICD-10-CM | POA: Diagnosis not present

## 2024-09-05 DIAGNOSIS — E861 Hypovolemia: Secondary | ICD-10-CM | POA: Diagnosis not present

## 2024-09-05 DIAGNOSIS — E875 Hyperkalemia: Secondary | ICD-10-CM | POA: Diagnosis not present

## 2024-09-05 DIAGNOSIS — E871 Hypo-osmolality and hyponatremia: Secondary | ICD-10-CM

## 2024-09-05 DIAGNOSIS — I10 Essential (primary) hypertension: Secondary | ICD-10-CM | POA: Diagnosis present

## 2024-09-05 DIAGNOSIS — I602 Nontraumatic subarachnoid hemorrhage from anterior communicating artery: Principal | ICD-10-CM | POA: Diagnosis present

## 2024-09-05 DIAGNOSIS — G911 Obstructive hydrocephalus: Secondary | ICD-10-CM | POA: Diagnosis present

## 2024-09-05 DIAGNOSIS — J9601 Acute respiratory failure with hypoxia: Secondary | ICD-10-CM | POA: Diagnosis present

## 2024-09-05 DIAGNOSIS — E222 Syndrome of inappropriate secretion of antidiuretic hormone: Secondary | ICD-10-CM | POA: Diagnosis present

## 2024-09-05 HISTORY — PX: IR 3D INDEPENDENT WKST: IMG2385

## 2024-09-05 HISTORY — PX: IR US GUIDE VASC ACCESS RIGHT: IMG2390

## 2024-09-05 HISTORY — PX: IR ANGIOGRAM FOLLOW UP STUDY: IMG697

## 2024-09-05 HISTORY — PX: RADIOLOGY WITH ANESTHESIA: SHX6223

## 2024-09-05 HISTORY — PX: IR CT HEAD LTD: IMG2386

## 2024-09-05 HISTORY — PX: IR TRANSCATH/EMBOLIZ: IMG695

## 2024-09-05 HISTORY — PX: IR ANGIO INTRA EXTRACRAN SEL COM CAROTID INNOMINATE UNI L MOD SED: IMG5358

## 2024-09-05 LAB — BASIC METABOLIC PANEL WITH GFR
Anion gap: 10 (ref 5–15)
Anion gap: 12 (ref 5–15)
BUN: 11 mg/dL (ref 8–23)
BUN: 9 mg/dL (ref 8–23)
CO2: 23 mmol/L (ref 22–32)
CO2: 22 mmol/L (ref 22–32)
Calcium: 8.8 mg/dL — ABNORMAL LOW (ref 8.9–10.3)
Calcium: 8.6 mg/dL — ABNORMAL LOW (ref 8.9–10.3)
Chloride: 106 mmol/L (ref 98–111)
Chloride: 107 mmol/L (ref 98–111)
Creatinine, Ser: 1.1 mg/dL (ref 0.61–1.24)
Creatinine, Ser: 1.15 mg/dL (ref 0.61–1.24)
GFR, Estimated: >60 mL/min (ref >60)
GFR, Estimated: >60 mL/min (ref >60)
Glucose, Bld: 141 mg/dL — ABNORMAL HIGH (ref 70–99)
Glucose, Bld: 135 mg/dL — ABNORMAL HIGH (ref 70–99)
Potassium: 4.1 mmol/L (ref 3.5–5.1)
Potassium: 3.8 mmol/L (ref 3.5–5.1)
Sodium: 138 mmol/L (ref 135–145)
Sodium: 141 mmol/L (ref 135–145)

## 2024-09-05 LAB — CBC
HCT: 35.5 % — ABNORMAL LOW (ref 39.0–52.0)
Hemoglobin: 12 g/dL — ABNORMAL LOW (ref 13.0–17.0)
MCH: 32.4 pg (ref 26.0–34.0)
MCHC: 33.8 g/dL (ref 30.0–36.0)
MCV: 95.9 fL (ref 80.0–100.0)
Platelets: 222 K/uL (ref 150–400)
RBC: 3.7 MIL/uL — ABNORMAL LOW (ref 4.22–5.81)
RDW: 12.4 % (ref 11.5–15.5)
WBC: 7.1 K/uL (ref 4.0–10.5)
nRBC: 0 % (ref 0.0–0.2)

## 2024-09-05 LAB — POCT I-STAT 7, (LYTES, BLD GAS, ICA,H+H)
Acid-Base Excess: 0 mmol/L (ref 0.0–2.0)
Acid-base deficit: 3 mmol/L — ABNORMAL HIGH (ref 0.0–2.0)
Bicarbonate: 21.6 mmol/L (ref 20.0–28.0)
Bicarbonate: 25 mmol/L (ref 20.0–28.0)
Calcium, Ion: 1.14 mmol/L — ABNORMAL LOW (ref 1.15–1.40)
Calcium, Ion: 1.15 mmol/L (ref 1.15–1.40)
HCT: 31 % — ABNORMAL LOW (ref 39.0–52.0)
HCT: 34 % — ABNORMAL LOW (ref 39.0–52.0)
Hemoglobin: 10.5 g/dL — ABNORMAL LOW (ref 13.0–17.0)
Hemoglobin: 11.6 g/dL — ABNORMAL LOW (ref 13.0–17.0)
O2 Saturation: 100 %
O2 Saturation: 100 %
Patient temperature: 99.3
Potassium: 3.7 mmol/L (ref 3.5–5.1)
Potassium: 4.3 mmol/L (ref 3.5–5.1)
Sodium: 139 mmol/L (ref 135–145)
Sodium: 140 mmol/L (ref 135–145)
TCO2: 23 mmol/L (ref 22–32)
TCO2: 26 mmol/L (ref 22–32)
pCO2 arterial: 35.6 mmHg (ref 32–48)
pCO2 arterial: 40.9 mmHg (ref 32–48)
pH, Arterial: 7.391 (ref 7.35–7.45)
pH, Arterial: 7.396 (ref 7.35–7.45)
pO2, Arterial: 202 mmHg — ABNORMAL HIGH (ref 83–108)
pO2, Arterial: 288 mmHg — ABNORMAL HIGH (ref 83–108)

## 2024-09-05 LAB — URINE DRUG SCREEN
Amphetamines: NEGATIVE
Barbiturates: NEGATIVE
Benzodiazepines: NEGATIVE
Cocaine: NEGATIVE
Fentanyl: POSITIVE — AB
Methadone Scn, Ur: NEGATIVE
Opiates: NEGATIVE
Tetrahydrocannabinol: NEGATIVE

## 2024-09-05 LAB — ABO/RH: ABO/RH(D): A POS

## 2024-09-05 LAB — ETHANOL: Alcohol, Ethyl (B): <15 mg/dL (ref <15)

## 2024-09-05 LAB — TYPE AND SCREEN
ABO/RH(D): A POS
Antibody Screen: NEGATIVE

## 2024-09-05 LAB — BLOOD GAS, VENOUS
Acid-base deficit: 1.8 mmol/L (ref 0.0–2.0)
Bicarbonate: 23.7 mmol/L (ref 20.0–28.0)
O2 Saturation: 60.9 %
Patient temperature: 37
pCO2, Ven: 42 mmHg — ABNORMAL LOW (ref 44–60)
pH, Ven: 7.36 (ref 7.25–7.43)
pO2, Ven: 37 mmHg (ref 32–45)

## 2024-09-05 LAB — PROTIME-INR
INR: 1 (ref 0.8–1.2)
Prothrombin Time: 13.4 s (ref 11.4–15.2)

## 2024-09-05 LAB — APTT: aPTT: 25 s (ref 24–36)

## 2024-09-05 LAB — MRSA NEXT GEN BY PCR, NASAL: MRSA by PCR Next Gen: NOT DETECTED

## 2024-09-05 LAB — LACTIC ACID, PLASMA: Lactic Acid, Venous: 2.2 mmol/L (ref 0.5–1.9)

## 2024-09-05 LAB — TROPONIN T, HIGH SENSITIVITY: Troponin T High Sensitivity: <15 ng/L (ref 0–19)

## 2024-09-05 LAB — POCT ACTIVATED CLOTTING TIME: Activated Clotting Time: 147 s

## 2024-09-05 SURGERY — RADIOLOGY WITH ANESTHESIA
Anesthesia: General

## 2024-09-05 MED ORDER — FENTANYL CITRATE (PF) 50 MCG/ML IJ SOSY: 50.0000 ug | PREFILLED_SYRINGE | Freq: Once | INTRAMUSCULAR | Status: AC

## 2024-09-05 MED ORDER — ONDANSETRON 4 MG PO TBDP: 4.0000 mg | ORAL_TABLET | Freq: Four times a day (QID) | ORAL | Status: DC | PRN

## 2024-09-05 MED ORDER — CLEVIDIPINE BUTYRATE 0.5 MG/ML IV EMUL: 0.0000 mg/h | INTRAVENOUS | Status: DC

## 2024-09-05 MED ORDER — ETOMIDATE 2 MG/ML IV SOLN
INTRAVENOUS | Status: AC
  Administered 2024-09-05: 31.3 mg via INTRAVENOUS
  Filled 2024-09-05: qty 20

## 2024-09-05 MED ORDER — ROCURONIUM BROMIDE 10 MG/ML (PF) SYRINGE
100.0000 mg | PREFILLED_SYRINGE | Freq: Once | INTRAVENOUS | Status: AC
  Administered 2024-09-05 (×2): 50 mg via INTRAVENOUS

## 2024-09-05 MED ORDER — VERAPAMIL HCL 2.5 MG/ML IV SOLN
INTRAVENOUS | Status: AC
  Filled 2024-09-05: qty 2

## 2024-09-05 MED ORDER — VERAPAMIL HCL 2.5 MG/ML IV SOLN: INTRA_ARTERIAL | Status: AC | PRN

## 2024-09-05 MED ORDER — PHENYLEPHRINE HCL-NACL 20-0.9 MG/250ML-% IV SOLN
INTRAVENOUS | Status: DC | PRN
Start: 2024-09-05 — End: 2024-09-05
  Administered 2024-09-05: 30 ug/min via INTRAVENOUS
  Administered 2024-09-05: 20 ug/min via INTRAVENOUS

## 2024-09-05 MED ORDER — IOHEXOL 300 MG/ML  SOLN
100.0000 mL | Freq: Once | INTRAMUSCULAR | Status: AC | PRN
  Administered 2024-09-05: 21 mL via INTRA_ARTERIAL

## 2024-09-05 MED ORDER — ONDANSETRON HCL 4 MG/2ML IJ SOLN
INTRAMUSCULAR | Status: DC | PRN
  Administered 2024-09-05: 4 mg via INTRAVENOUS

## 2024-09-05 MED ORDER — PROPOFOL 1000 MG/100ML IV EMUL
0.0000 ug/kg/min | INTRAVENOUS | Status: DC
  Administered 2024-09-05: 30 mg via INTRAVENOUS
  Administered 2024-09-05: 60 ug/kg/min via INTRAVENOUS
  Administered 2024-09-06: 15 ug/kg/min via INTRAVENOUS
  Filled 2024-09-05 (×3): qty 100

## 2024-09-05 MED ORDER — ACETAMINOPHEN 325 MG PO TABS
650.0000 mg | ORAL_TABLET | ORAL | Status: DC | PRN
  Administered 2024-09-10 – 2024-09-18 (×17): 650 mg via ORAL
  Filled 2024-09-05 (×18): qty 2

## 2024-09-05 MED ORDER — DEXAMETHASONE SOD PHOSPHATE PF 10 MG/ML IJ SOLN
INTRAMUSCULAR | Status: DC | PRN
  Administered 2024-09-05: 10 mg via INTRAVENOUS

## 2024-09-05 MED ORDER — ETOMIDATE 2 MG/ML IV SOLN: 0.3000 mg/kg | Freq: Once | INTRAVENOUS | Status: AC

## 2024-09-05 MED ORDER — ACETAMINOPHEN 650 MG RE SUPP: 650.0000 mg | RECTAL | Status: DC | PRN

## 2024-09-05 MED ORDER — FENTANYL CITRATE (PF) 250 MCG/5ML IJ SOLN
INTRAMUSCULAR | Status: AC
  Filled 2024-09-05: qty 5

## 2024-09-05 MED ORDER — CLEVIDIPINE BUTYRATE 0.5 MG/ML IV EMUL
0.0000 mg/h | INTRAVENOUS | Status: DC
  Administered 2024-09-05: 18 mg/h via INTRAVENOUS

## 2024-09-05 MED ORDER — NIMODIPINE 30 MG PO CAPS
60.0000 mg | ORAL_CAPSULE | ORAL | Status: DC
  Administered 2024-09-06 – 2024-09-19 (×75): 60 mg via ORAL
  Filled 2024-09-05 (×80): qty 2

## 2024-09-05 MED ORDER — LEVETIRACETAM (KEPPRA) 500 MG/5 ML ADULT IV PUSH
1000.0000 mg | Freq: Once | INTRAVENOUS | Status: AC
  Administered 2024-09-05: 1000 mg via INTRAVENOUS
  Filled 2024-09-05: qty 10

## 2024-09-05 MED ORDER — LEVETIRACETAM 500 MG PO TABS
500.0000 mg | ORAL_TABLET | Freq: Two times a day (BID) | ORAL | Status: DC
  Administered 2024-09-06 (×2): 500 mg
  Filled 2024-09-05 (×2): qty 1

## 2024-09-05 MED ORDER — ROCURONIUM BROMIDE 10 MG/ML (PF) SYRINGE: 1.0000 mg/kg | PREFILLED_SYRINGE | Freq: Once | INTRAVENOUS | Status: DC

## 2024-09-05 MED ORDER — ACETAMINOPHEN 325 MG PO TABS: 650.0000 mg | ORAL_TABLET | ORAL | Status: DC | PRN

## 2024-09-05 MED ORDER — MIDAZOLAM HCL (PF) 2 MG/2ML IJ SOLN
1.0000 mg | INTRAMUSCULAR | Status: DC | PRN
  Administered 2024-09-05: 2 mg via INTRAVENOUS

## 2024-09-05 MED ORDER — IOHEXOL 350 MG/ML SOLN
75.0000 mL | Freq: Once | INTRAVENOUS | Status: AC | PRN
  Administered 2024-09-05: 75 mL via INTRAVENOUS

## 2024-09-05 MED ORDER — HEPARIN SODIUM (PORCINE) 1000 UNIT/ML IJ SOLN
INTRAMUSCULAR | Status: DC | PRN
  Administered 2024-09-05: 1000 [IU] via INTRAVENOUS
  Administered 2024-09-05: 5000 [IU] via INTRAVENOUS

## 2024-09-05 MED ORDER — CLEVIDIPINE BUTYRATE 0.5 MG/ML IV EMUL
INTRAVENOUS | Status: AC
Start: 2024-09-05 — End: 2024-09-05
  Filled 2024-09-05: qty 50

## 2024-09-05 MED ORDER — FENTANYL CITRATE (PF) 50 MCG/ML IJ SOSY
50.0000 ug | PREFILLED_SYRINGE | Freq: Once | INTRAMUSCULAR | Status: AC
  Administered 2024-09-05: 50 ug via INTRAVENOUS

## 2024-09-05 MED ORDER — MIDAZOLAM-SODIUM CHLORIDE 100-0.9 MG/100ML-% IV SOLN
INTRAVENOUS | Status: AC
  Filled 2024-09-05: qty 100

## 2024-09-05 MED ORDER — SODIUM CHLORIDE 0.9 % IV SOLN: INTRAVENOUS | Status: DC

## 2024-09-05 MED ORDER — NIMODIPINE 30 MG PO CAPS
60.0000 mg | ORAL_CAPSULE | ORAL | Status: DC
  Filled 2024-09-05 (×3): qty 2

## 2024-09-05 MED ORDER — ONDANSETRON HCL 4 MG/2ML IJ SOLN
4.0000 mg | Freq: Four times a day (QID) | INTRAMUSCULAR | Status: DC | PRN
Start: 2024-09-05 — End: 2024-09-05

## 2024-09-05 MED ORDER — LIDOCAINE-EPINEPHRINE 1 %-1:100000 IJ SOLN
20.0000 mL | Freq: Once | INTRAMUSCULAR | Status: DC
  Filled 2024-09-05: qty 1

## 2024-09-05 MED ORDER — POLYETHYLENE GLYCOL 3350 17 G PO PACK: 17.0000 g | PACK | Freq: Every day | ORAL | Status: DC

## 2024-09-05 MED ORDER — DOCUSATE SODIUM 50 MG/5ML PO LIQD
100.0000 mg | Freq: Two times a day (BID) | ORAL | Status: DC
  Administered 2024-09-06 (×2): 100 mg
  Filled 2024-09-05 (×2): qty 10

## 2024-09-05 MED ORDER — FENTANYL 2500MCG IN NS 250ML (10MCG/ML) PREMIX INFUSION: 0.0000 ug/h | INTRAVENOUS | Status: DC

## 2024-09-05 MED ORDER — STROKE: EARLY STAGES OF RECOVERY BOOK: Freq: Once | Status: DC

## 2024-09-05 MED ORDER — FENTANYL CITRATE (PF) 50 MCG/ML IJ SOSY
25.0000 ug | PREFILLED_SYRINGE | Freq: Once | INTRAMUSCULAR | Status: DC
  Administered 2024-09-05: 50 ug via INTRAVENOUS

## 2024-09-05 MED ORDER — LABETALOL HCL 5 MG/ML IV SOLN: 20.0000 mg | Freq: Once | INTRAVENOUS | Status: DC | PRN

## 2024-09-05 MED ORDER — FENTANYL BOLUS VIA INFUSION
25.0000 ug | INTRAVENOUS | Status: DC | PRN
  Administered 2024-09-05 – 2024-09-06 (×4): 100 ug via INTRAVENOUS

## 2024-09-05 MED ORDER — LABETALOL HCL 5 MG/ML IV SOLN
INTRAVENOUS | Status: AC
  Filled 2024-09-05: qty 4

## 2024-09-05 MED ORDER — ROCURONIUM BROMIDE 10 MG/ML (PF) SYRINGE
PREFILLED_SYRINGE | INTRAVENOUS | Status: AC
  Administered 2024-09-05: 100 mg via INTRAVENOUS
  Filled 2024-09-05: qty 20

## 2024-09-05 MED ORDER — NIMODIPINE 6 MG/ML PO SOLN
60.0000 mg | ORAL | Status: DC
  Filled 2024-09-05: qty 10

## 2024-09-05 MED ORDER — ACETAMINOPHEN 325 MG RE SUPP: 650.0000 mg | RECTAL | Status: DC | PRN

## 2024-09-05 MED ORDER — LACTATED RINGERS IV SOLN: INTRAVENOUS | Status: DC | PRN

## 2024-09-05 MED ORDER — PROPOFOL 1000 MG/100ML IV EMUL
INTRAVENOUS | Status: AC
  Administered 2024-09-05: 20 ug/kg/min via INTRAVENOUS
  Filled 2024-09-05: qty 100

## 2024-09-05 MED ORDER — IOHEXOL 300 MG/ML  SOLN
150.0000 mL | Freq: Once | INTRAMUSCULAR | Status: AC | PRN
  Administered 2024-09-05: 49 mL via INTRA_ARTERIAL

## 2024-09-05 MED ORDER — NIMODIPINE 6 MG/ML PO SOLN
60.0000 mg | ORAL | Status: DC
  Administered 2024-09-06 – 2024-09-19 (×7): 60 mg
  Filled 2024-09-05 (×25): qty 10

## 2024-09-05 MED ORDER — PANTOPRAZOLE SODIUM 40 MG PO TBEC: 40.0000 mg | DELAYED_RELEASE_TABLET | Freq: Every day | ORAL | Status: DC

## 2024-09-05 MED ORDER — FENTANYL CITRATE (PF) 50 MCG/ML IJ SOSY
PREFILLED_SYRINGE | INTRAMUSCULAR | Status: AC
  Filled 2024-09-05: qty 1

## 2024-09-05 MED ORDER — ROCURONIUM BROMIDE 10 MG/ML (PF) SYRINGE: 100.0000 mg | PREFILLED_SYRINGE | Freq: Once | INTRAVENOUS | Status: AC

## 2024-09-05 MED ORDER — FENTANYL CITRATE (PF) 50 MCG/ML IJ SOSY
PREFILLED_SYRINGE | INTRAMUSCULAR | Status: AC
  Administered 2024-09-05: 50 ug via INTRAVENOUS
  Filled 2024-09-05: qty 1

## 2024-09-05 MED ORDER — ONDANSETRON HCL 4 MG/2ML IJ SOLN: 4.0000 mg | Freq: Four times a day (QID) | INTRAMUSCULAR | Status: DC | PRN

## 2024-09-05 MED ORDER — EPTIFIBATIDE 20 MG/10ML IV SOLN
INTRAVENOUS | Status: AC | PRN
  Administered 2024-09-05 (×2): 2.5 mg via INTRA_ARTERIAL

## 2024-09-05 MED ORDER — CHLORHEXIDINE GLUCONATE CLOTH 2 % EX PADS
6.0000 | MEDICATED_PAD | Freq: Every day | CUTANEOUS | Status: DC
  Administered 2024-09-06 – 2024-09-16 (×11): 6 via TOPICAL

## 2024-09-05 MED ORDER — LABETALOL HCL 5 MG/ML IV SOLN: 0.0000 mg | INTRAVENOUS | Status: DC | PRN

## 2024-09-05 MED ORDER — PHENYLEPHRINE 80 MCG/ML (10ML) SYRINGE FOR IV PUSH (FOR BLOOD PRESSURE SUPPORT)
PREFILLED_SYRINGE | INTRAVENOUS | Status: DC | PRN
  Administered 2024-09-05: 80 ug via INTRAVENOUS
  Administered 2024-09-05: 160 ug via INTRAVENOUS

## 2024-09-05 MED ORDER — PANTOPRAZOLE SODIUM 40 MG IV SOLR: 40.0000 mg | Freq: Every day | INTRAVENOUS | Status: DC

## 2024-09-05 MED ORDER — STROKE: EARLY STAGES OF RECOVERY BOOK
Freq: Once | Status: AC
Start: 2024-09-06 — End: 2024-09-06

## 2024-09-05 MED ORDER — DOCUSATE SODIUM 100 MG PO CAPS: 100.0000 mg | ORAL_CAPSULE | Freq: Two times a day (BID) | ORAL | Status: DC

## 2024-09-05 MED ORDER — EPTIFIBATIDE 20 MG/10ML IV SOLN
INTRAVENOUS | Status: AC
  Filled 2024-09-05: qty 10

## 2024-09-05 MED ORDER — VERAPAMIL HCL 2.5 MG/ML IV SOLN
INTRAVENOUS | Status: AC | PRN
  Administered 2024-09-05: 3 mg via INTRA_ARTERIAL

## 2024-09-05 MED ORDER — ACETAMINOPHEN 160 MG/5ML PO SOLN: 650.0000 mg | ORAL | Status: DC | PRN

## 2024-09-05 MED ORDER — CLEVIDIPINE BUTYRATE 0.5 MG/ML IV EMUL
0.0000 mg/h | INTRAVENOUS | Status: DC
  Administered 2024-09-05: 2 mg/h via INTRAVENOUS
  Filled 2024-09-05: qty 50

## 2024-09-05 MED ORDER — MIDAZOLAM-SODIUM CHLORIDE 100-0.9 MG/100ML-% IV SOLN
0.0000 mg/h | INTRAVENOUS | Status: DC
  Administered 2024-09-05: 2 mg/h via INTRAVENOUS

## 2024-09-05 MED ORDER — PROPOFOL 1000 MG/100ML IV EMUL: 0.0000 ug/kg/min | INTRAVENOUS | Status: DC

## 2024-09-05 MED ORDER — ROCURONIUM BROMIDE 10 MG/ML (PF) SYRINGE
PREFILLED_SYRINGE | INTRAVENOUS | Status: AC
  Filled 2024-09-05: qty 10

## 2024-09-05 MED ORDER — FENTANYL 2500MCG IN NS 250ML (10MCG/ML) PREMIX INFUSION
0.0000 ug/h | INTRAVENOUS | Status: DC
  Administered 2024-09-05: 50 ug/h via INTRAVENOUS
  Administered 2024-09-06: 200 ug/h via INTRAVENOUS
  Filled 2024-09-05 (×2): qty 250

## 2024-09-05 MED ORDER — PANTOPRAZOLE SODIUM 40 MG IV SOLR
40.0000 mg | Freq: Every day | INTRAVENOUS | Status: DC
  Administered 2024-09-06: 40 mg via INTRAVENOUS
  Filled 2024-09-05: qty 10

## 2024-09-05 MED ORDER — NITROGLYCERIN 1 MG/10 ML FOR IR/CATH LAB
INTRA_ARTERIAL | Status: AC
  Filled 2024-09-05: qty 10

## 2024-09-05 NOTE — Transfer of Care (Signed)
 Immediate Anesthesia Transfer of Care Note  Patient: Clinton Phillips  Procedure(s) Performed: RADIOLOGY WITH ANESTHESIA  Patient Location: ICU  Anesthesia Type:General  Level of Consciousness: Patient remains intubated per anesthesia plan  Airway & Oxygen Therapy: Patient remains intubated per anesthesia plan and Patient placed on Ventilator (see vital sign flow sheet for setting)  Post-op Assessment: Report given to RN and Post -op Vital signs reviewed and stable  Post vital signs: Reviewed and stable  Last Vitals:  Vitals Value Taken Time  BP 142/83 09/05/24 18:30  Temp    Pulse 66 09/05/24 18:32  Resp 18 09/05/24 18:32  SpO2 100 % 09/05/24 18:32  Vitals shown include unfiled device data.  Last Pain: There were no vitals filed for this visit.       Complications: No notable events documented.

## 2024-09-05 NOTE — Anesthesia Procedure Notes (Signed)
 Arterial Line Insertion Start/End11/24/2025 4:16 PM, 09/05/2024 4:22 PM Performed by: Lucious Debby BRAVO, MD, anesthesiologist  Patient location: OR. Preanesthetic checklist: patient identified, IV checked, risks and benefits discussed, surgical consent, monitors and equipment checked, pre-op evaluation, timeout performed and anesthesia consent Patient sedated Left, radial was placed Catheter size: 20 G Hand hygiene performed   Attempts: 3 (Previous attempts by CRNA unsuccessful) Following insertion, dressing applied and Biopatch. Post procedure assessment: unchanged and normal  Patient tolerated the procedure well with no immediate complications.

## 2024-09-05 NOTE — ED Notes (Signed)
 Pt back to room from stat CT. X-ray at the bedside.

## 2024-09-05 NOTE — Consult Note (Signed)
 He is 63 years old and presented today at [facility].  He complained of a mild headache last night.  He was difficult to arouse this morning.  Apparently was able to state his name and where he was at [facility] and was moving all extremities.  He is now intubated.  His CT study demonstrates a diffuse subarachnoid hemorrhage.  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  I spoke with his fiance Corean and he does not have any significant past medical history, but was unwilling to make normal doctor visits, so he has not had any medical attention.  He quit smoking 17 days ago.  He has an occasional drink of alcohol but is not drink much or on a daily basis.  With the sedation off, at present he does not open his eyes or withdraw to noxious stimuli.  His CT study does show hydrocephalus.  I reviewed the CTA study.  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  Radial access might be preferable.  Will need to confirm the aneurysm and suitability for treatment with diagnostic angiography before proceeding with treatment.  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  Recommendation:  I spoke at length to his fiance Corean.  They have lived together for 4 years.  She wants to pursue aggressive treatment and feels that he would like to do the same if he could decide for himself.  I have explained the need for a ventriculostomy, and the risks involved in that procedure (which would be done by Dr. Rosslyn).  I also explained the cerebral arteriogram and coiling of the aneurysm.  I have explained why this is necessary, but also that there is a risk of causing a stroke or rupture of the aneurysm, which could be fatal.  Dr. Rosslyn and I have discussed the treatment options and our joint recommendation is coiling, with clipping if coiling  is not possible.

## 2024-09-05 NOTE — ED Notes (Signed)
 CCMD notified of cardiac monitoring order.

## 2024-09-05 NOTE — ED Notes (Signed)
 Propofol  titrated from 20mcg to 40 mcg/kg/min per VO Mumma.

## 2024-09-05 NOTE — Progress Notes (Signed)
 PT Cancellation Note  Patient Details Name: Evie Crumpler MRN: [id number] DOB: [birth year]   Cancelled Treatment:    Reason Eval/Treat Not Completed: Patient not medically ready;Patient at procedure or test/unavailable  Noted pt intubated since PT order written and currently off the unit. Will followup 11/25 if appropriate.    Macario RAMAN, PT Acute Rehabilitation Services  Office [phone]  Macario SHAUNNA Soja 09/05/2024, 4:23 PM

## 2024-09-05 NOTE — Anesthesia Preprocedure Evaluation (Addendum)
 Anesthesia Evaluation  Patient identified by MRN, date of birth, ID band Patient unresponsive    Reviewed: Allergy & Precautions, Patient's Chart, lab work & pertinent test results, Unable to perform ROS - Chart review only  History of Anesthesia Complications Negative for: history of anesthetic complications  Airway Mallampati: Intubated       Dental   Pulmonary Current Smoker   breath sounds clear to auscultation   + intubated    Cardiovascular  Rhythm:Regular Rate:Normal     Neuro/Psych  SAH     GI/Hepatic ,,,(+)     substance abuse  alcohol use and marijuana use  Endo/Other   Obesity   Renal/GU      Musculoskeletal   Abdominal   Peds  Hematology  (+) Blood dyscrasia, anemia   Anesthesia Other Findings   Reproductive/Obstetrics                              Anesthesia Physical Anesthesia Plan  ASA: 4 and emergent  Anesthesia Plan: General   Post-op Pain Management: Minimal or no pain anticipated   Induction: Inhalational  PONV Risk Score and Plan: 2 and Treatment may vary due to age or medical condition, Ondansetron  and Dexamethasone   Airway Management Planned: Oral ETT  Additional Equipment: Arterial line  Intra-op Plan:   Post-operative Plan: Post-operative intubation/ventilation  Informed Consent:      History available from chart only  Plan Discussed with: CRNA and Anesthesiologist  Anesthesia Plan Comments:          Anesthesia Quick Evaluation

## 2024-09-05 NOTE — Progress Notes (Signed)
 TCD exam attempted, however patient getting sterile procedure done in room.  Will re attempt as patient availability and schedule permit.   Ezzie Potters, RVT/RDMS 15:11 09/05/2024

## 2024-09-05 NOTE — Sedation Documentation (Signed)
 1707 ACT: 147

## 2024-09-05 NOTE — H&P (Addendum)
 NAME:  Clinton Phillips, MRN:  [id number], DOB:  04/09/1961, LOS: 0 ADMISSION DATE:  09/05/2024, CONSULTATION DATE:  09/05/24 REFERRING MD:  Suzanne - [facility] CHIEF COMPLAINT:  Headache   History of Present Illness:  Pt is encephelopathic; therefore, this HPI is obtained from chart review. Clinton Phillips is a 63 y.o. male who has no significant PMH. He presented to [facility] ED 11/24 with headache that started the night prior around midnight before he went to bed.  He didn't wake up for work morning of 11/24 so family checked on him and found him to be altered.  He was taken to [facility] where [facility] showed acute SAH with concern for Acomm rupture and small volume IVH with mild ventriculomegaly c/w hydrocephalus. 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  Case was discussed with neurosurgery who recommended emergent transfer to [facility] ICU for further evaluation and management. Prior to transfer to [facility], he was intubated for airway protection.  Pertinent  Medical History:  has SAH (subarachnoid hemorrhage) (HCC) on their problem list.  Significant Hospital Events: Including procedures, antibiotic start and stop dates in addition to other pertinent events   11/24 admit.  Interim History / Subjective:  Sedated on high dose Fentanyl . Still a bit agitated.  Objective:  Blood pressure (!) 157/86, pulse (!) 113, resp. rate 15, SpO2 95%.    Vent Mode: PRVC FiO2 (%):  [40 %] 40 % Set Rate:  [18 bmp] 18 bmp Vt Set:  [500 mL-640 mL] 640 mL PEEP:  [5 cmH20] 5 cmH20 Plateau Pressure:  [19 cmH20] 19 cmH20  No intake or output data in the 24 hours ending 09/05/24 1356  There were no vitals filed for this visit.   Physical Exam: General: Adult male, resting in bed, in NAD. Neuro: Sedated, MAE's, a bit agitated. HEENT: [place]/AT. Sclerae anicteric. ETT in place. Cardiovascular: RRR, no M/R/G.  Lungs: Respirations even and unlabored.  CTA bilaterally, No W/R/R. Abdomen: BS x 4, soft, NT/ND.   Musculoskeletal: No gross deformities, no edema.    Labs/imaging personally reviewed:  CT head 11/24 > acute SAH with concern for Acom rupture and small volume IVH with mild ventriculomegaly c/w hydrocephalus. CTA head 11/24 >  small Acomm aneurysm.  Assessment & Plan:   [facility] - felt to be 2/2 ruptured AComm aneurysm. At risk for vasospasm, seizures. - Neurosurgery consulted and aware, Dr. Lester to see pt shortly as well as Dr. Janjua for possible EVD at bedside prior to angiogram. - Angiogram with intervention per NSGY. - Continue Cleviprex , goal SBP 120 - 140 initially until aneurysm has been secured. - Once aneurysm has been secured, goal normotension thereafter (avoid hypotension). - PRN Labetalol  for above goal. - Continue Nimodipine . - TCD's MWF. - Empiric Keppra  x 7 days prophylactically (unless seizure, then longer duration). - Foley.  Acute metabolic encephalopathy - 2/2 above. UDS negative. - Supportive care.  Ventilator Management - pt intubated for airway protection prior to transfer to [facility]. Hx tobacco dependence - quit 2 weeks prior to admit per notes. CT imaging does suggest emphysema. - Continue full vent support. - Wean as able. - Daily SBT/WUA to assess for extubation readiness. - Bronchial hygiene. - Swallow eval once extubated. - Continue tobacco cessation.    Labs   CBC: Recent Labs  Lab 09/05/24 1101  WBC 7.1  HGB 12.0*  HCT 35.5*  MCV 95.9  PLT 222    Basic Metabolic Panel: Recent Labs  Lab 09/05/24 1101  NA 138  K 4.1  CL 106  CO2 23  GLUCOSE 141*  BUN 11  CREATININE 1.10  CALCIUM 8.8*   GFR: Estimated Creatinine Clearance: 86.1 mL/min (by C-G formula based on SCr of 1.1 mg/dL). Recent Labs  Lab 09/05/24 1101  WBC 7.1  LATICACIDVEN 2.2*    Liver Function Tests: No results for input(s): AST, ALT, ALKPHOS, BILITOT, PROT, ALBUMIN in the last 168 hours. No results for input(s): LIPASE, AMYLASE in the last 168  hours. No results for input(s): AMMONIA in the last 168 hours.  ABG    Component Value Date/Time   HCO3 23.7 09/05/2024 1102   ACIDBASEDEF 1.8 09/05/2024 1102   O2SAT 60.9 09/05/2024 1102     Coagulation Profile: Recent Labs  Lab 09/05/24 1101  INR 1.0    Cardiac Enzymes: No results for input(s): CKTOTAL, CKMB, CKMBINDEX, TROPONINI in the last 168 hours.  HbA1C: No results found for: HGBA1C  CBG: No results for input(s): GLUCAP in the last 168 hours.  Review of Systems:   Unable to obtain as pt is encephalopathic.  Past Medical History:  He,  has a past medical history of Anemia.   Surgical History:  No past surgical history on file.   Social History:   reports that he has been smoking cigarettes. He has a 23 pack-year smoking history. He has never used smokeless tobacco. He reports current alcohol use of about 12.0 standard drinks of alcohol per week. He reports current drug use. Drug: Marijuana.   Family History:  His family history is not on file.   Allergies No Known Allergies   Home Medications  Prior to Admission medications   Medication Sig Start Date End Date Taking? Authorizing Provider  cyclobenzaprine  (FLEXERIL ) 5 MG tablet Take 1-2 tablets (5-10 mg total) by mouth 3 (three) times daily as needed for muscle spasms. Patient not taking: Reported on 09/05/2024 03/18/23   Charlene Debby BROCKS, PA-C  cyclobenzaprine  (FLEXERIL ) 5 MG tablet Take 1-2 tablets (5-10 mg total) by mouth 3 (three) times daily as needed for muscle spasms. Patient not taking: Reported on 09/05/2024 03/18/23   Gaines, Thomas C, PA-C  HYDROcodone -acetaminophen  (NORCO) 5-325 MG tablet Take 1 tablet by mouth every 6 (six) hours as needed for moderate pain. Patient not taking: Reported on 09/05/2024 03/18/23   Charlene Debby BROCKS, PA-C  ondansetron  (ZOFRAN  ODT) 4 MG disintegrating tablet Take 1 tablet (4 mg total) by mouth every 8 (eight) hours as needed for nausea or vomiting. Patient  not taking: Reported on 09/05/2024 01/05/21   Angelena Smalls, MD  predniSONE  (DELTASONE ) 10 MG tablet Take 1 tablet (10 mg total) by mouth daily. 6,5,4,3,2,1 six day taper Patient not taking: Reported on 09/05/2024 03/18/23   Charlene Debby BROCKS, PA-C     Critical care time: 35 min.   Sammi Gore, PA - C  Pulmonary & Critical Care Medicine For pager details, please see AMION or use Epic chat  After 1900, please call [facility] for cross coverage needs 09/05/2024, 1:56 PM

## 2024-09-05 NOTE — CV Procedure (Signed)
  NEUROSURGERY BRIEF OP NOTE   PREOP DX: ruptured acomm    POSTOP DX: Same  PROCEDURE: coil form right radial access  SURGEON: Nancyann LULLA Burns   ANESTHESIA: GETA  EBL: minimal  COMPLICATIONS: No immediate  CONDITION: Stable  FINDINGS:  2.5 mm and 1.5 mm acomm aneurysms, both coiled 2.5 mm distal right ACA aneurysm with smooth dome likely unruptured   Nancyann LULLA Burns  @today @ 5:49 PM

## 2024-09-05 NOTE — Progress Notes (Signed)
 Patient trasnported from 4N15 to IR and back to 4N15 with RT, CRNA and RN x2 at bedside. No complications noted.

## 2024-09-05 NOTE — ED Notes (Signed)
 Pt intubated by MD mumma. 25cm at the lip. Propofol  infusion started.

## 2024-09-05 NOTE — ED Notes (Signed)
 CT called by this nurse for patient to come to CT STAT for CTA head and neck. CT stated they have full tables and will get the patient next.

## 2024-09-05 NOTE — ED Triage Notes (Signed)
 Pt Bib [facility] for AMS. Family stated the patient was altered upon wakening. Pt c/o headache at bedtime last night. Pt normally works and did not get up for work this AM. Pt groggy at triage but will answer questions appropriately.

## 2024-09-05 NOTE — ED Provider Notes (Signed)
 [facility] Provider Note    Event Date/Time   First MD Initiated Contact with Patient 09/05/24 1054     (approximate)   History   Altered Mental Status   HPI  Clinton Phillips is a 63 y.o. male no significant past medical history presents to the emergency department with altered mental status.  Last known well was last night at midnight.  Complaining of a mild headache last night prior to going to sleep.  This morning difficult to arouse.  Able to state his name and where he is.  Moving all extremities.  Normal glucose.  History of tobacco use but quit smoking approximately 2 weeks ago.  No significant alcohol use.  No falls or trauma.  Not on anticoagulation.     Physical Exam   Triage Vital Signs: ED Triage Vitals  Encounter Vitals Group     BP 09/05/24 1057 (!) 173/99     Girls Systolic BP Percentile --      Girls Diastolic BP Percentile --      Boys Systolic BP Percentile --      Boys Diastolic BP Percentile --      Pulse Rate 09/05/24 1057 91     Resp 09/05/24 1057 (!) 21     Temp 09/05/24 1057 98.2 F (36.8 C)     Temp Source 09/05/24 1057 Oral     SpO2 09/05/24 1055 98 %     Weight 09/05/24 1058 230 lb (104.3 kg)     Height 09/05/24 1058 6' 1 (1.854 m)     Head Circumference --      Peak Flow --      Pain Score 09/05/24 1101 0     Pain Loc --      Pain Education --      Exclude from Growth Chart --     Most recent vital signs: Vitals:   09/05/24 1218 09/05/24 1221  BP: (!) 156/93 (!) 155/93  Pulse: 69 72  Resp: 18 18  Temp:    SpO2: 100% 100%    Physical Exam Constitutional:      Appearance: He is well-developed.     Comments: Somnolent but arousable  HENT:     Head: Atraumatic.  Eyes:     Conjunctiva/sclera: Conjunctivae normal.     Comments: Pupils are equal and reactive.  Right eye does appear to be down and with lateral gaze  Cardiovascular:     Rate and Rhythm: Regular rhythm.  Pulmonary:     Effort: No  respiratory distress.  Abdominal:     Tenderness: There is no abdominal tenderness.  Musculoskeletal:        General: Normal range of motion.     Cervical back: Normal range of motion. No tenderness.  Skin:    General: Skin is warm.     Capillary Refill: Capillary refill takes less than 2 seconds.  Neurological:     Mental Status: Mental status is at baseline. He is lethargic.     GCS: GCS eye subscore is 4. GCS verbal subscore is 5. GCS motor subscore is 6.     Cranial Nerves: Cranial nerves 2-12 are intact.     Sensory: Sensation is intact.     Motor: Motor function is intact.     Comments: Moving all extremities.  5/5 strength bilateral upper and lower extremities.  Sensation is intact.  Somnolent but arousable.  Answering questions appropriately.     IMPRESSION / MDM / ASSESSMENT AND  PLAN / ED COURSE  I reviewed the triage vital signs and the nursing notes.  On arrival differential diagnosis including intracranial hemorrhage, CVA, electrolyte abnormality, acute intoxication, infectious process  Immediately taken back to CT scanner given significant concern for intracranial hemorrhage with possible subarachnoid hemorrhage  EKG  I, Clotilda Punter, the attending physician, personally viewed and interpreted this ECG.   Rate: Normal  Rhythm: Normal sinus  Axis: Normal  Intervals: Normal  ST&T Change: None  Episode of sinus bradycardia with a heart rate in the 40s while on cardiac telemetry.  RADIOLOGY I independently reviewed imaging, my interpretation of imaging: CT scan of the head with signs of intracranial hemorrhage  CTA with concern for ACOM aneurysm rupture  Discussed the results with radiologist on-call  LABS (all labs ordered are listed, but only abnormal results are displayed) Labs interpreted as -    Labs Reviewed  CBC - Abnormal; Notable for the following components:      Result Value   RBC 3.70 (*)    Hemoglobin 12.0 (*)    HCT 35.5 (*)    All other  components within normal limits  BASIC METABOLIC PANEL WITH GFR - Abnormal; Notable for the following components:   Glucose, Bld 141 (*)    Calcium 8.8 (*)    All other components within normal limits  BLOOD GAS, VENOUS - Abnormal; Notable for the following components:   pCO2, Ven 42 (*)    All other components within normal limits  LACTIC ACID, PLASMA - Abnormal; Notable for the following components:   Lactic Acid, Venous 2.2 (*)    All other components within normal limits  ETHANOL  PROTIME-INR  APTT  LACTIC ACID, PLASMA  URINE DRUG SCREEN  TROPONIN T, HIGH SENSITIVITY     MDM   Clinical Course as of 09/05/24 1226  Mon Sep 05, 2024  1104 After my evaluation with headache last night and worsening altered mental status today.  Concern for possible intracranial hemorrhage.  Called over to CT scan for stat CT of the brain without contrast.  Nonfocal neurologic exam with an NIH score of 0.  Last known well was midnight.  VAN neg [SM]  1123 Walked over to CT scan and findings concerning for subarachnoid hemorrhage.  Discussed with radiologist concern for subarachnoid hemorrhage with an aneurysm pattern.  Called back over to CT scan to take him back to CT for CTA to evaluate for ruptured aneurysm.  Does have some findings of hydrocephalus of intraventricular hemorrhage on CT scan.  Continues to have a GCS of 14 and is protecting his airway at this time.  Has a nonfocal neurologic exam. [SM]  1124 Consulted neurosurgery with Dr. Claudene, discussed with Dr. Rosslyn with vascular neurosurgery who recommended transfer immediately to [facility] for likely procedure.   [SM]  1219 Notified by neurosurgery for CTA and plan to go to neuro ICU.  Patient was intubated for airway protection given intracranial hemorrhage.  Patient will be accepted to the neuro ICU by Dr. Zaida.  Patient was intubated for airway protection.  Started on propofol , fentanyl  drip and Cleviprex .  Feel that the patient needs  emergent transfer to [facility] ICU given concern for possible hydrocephalus and need for an emergent procedure. [SM]    Clinical Course User Index [SM] Punter Clotilda, MD     PROCEDURES:  Critical Care performed: yes  .Critical Care  Performed by: Punter Clotilda, MD Authorized by: Punter Clotilda, MD   Critical care provider statement:  Critical care time (minutes):  60   Critical care time was exclusive of:  Separately billable procedures and treating other patients   Critical care was necessary to treat or prevent imminent or life-threatening deterioration of the following conditions:  CNS failure or compromise   Critical care was time spent personally by me on the following activities:  Development of treatment plan with patient or surrogate, discussions with consultants, evaluation of patient's response to treatment, examination of patient, ordering and review of laboratory studies, ordering and review of radiographic studies, ordering and performing treatments and interventions, pulse oximetry, re-evaluation of patient's condition and review of old charts   Care discussed with: accepting provider at another facility   Procedure Name: Intubation Date/Time: 09/05/2024 12:26 PM  Performed by: Suzanne Kirsch, MDPre-anesthesia Checklist: Suction available, Emergency Drugs available, Patient identified, Patient being monitored and Timeout performed Oxygen Delivery Method: Ambu bag Preoxygenation: Pre-oxygenation with 100% oxygen Induction Type: Rapid sequence Ventilation: Mask ventilation without difficulty Laryngoscope Size: Glidescope and 3 Grade View: Grade I Tube size: 7.5 mm Number of attempts: 1 Airway Equipment and Method: Video-laryngoscopy Placement Confirmation: ETT inserted through vocal cords under direct vision, Positive ETCO2, CO2 detector and Breath sounds checked- equal and bilateral Secured at: 25 cm Tube secured with: ETT holder Dental Injury: Teeth and Oropharynx  as per pre-operative assessment  Future Recommendations: Recommend- induction with short-acting agent, and alternative techniques readily available      Patient's presentation is most consistent with acute presentation with potential threat to life or bodily function.   MEDICATIONS ORDERED IN ED: Medications  propofol  (DIPRIVAN ) 1000 MG/100ML infusion (75 mcg/kg/min  104.3 kg Intravenous Rate/Dose Change 09/05/24 1222)  labetalol  (NORMODYNE ) injection 20 mg (has no administration in time range)    And  clevidipine  (CLEVIPREX ) infusion 0.5 mg/mL (4 mg/hr Intravenous Rate/Dose Change 09/05/24 1225)  fentaNYL  (SUBLIMAZE ) 50 MCG/ML injection (has no administration in time range)  fentaNYL  in NS (10mcg/ml) infusion-PREMIX (has no administration in time range)  etomidate  (AMIDATE ) injection 31.3 mg (31.3 mg Intravenous Given 09/05/24 1151)  rocuronium  (ZEMURON ) injection 100 mg (100 mg Intravenous Given 09/05/24 1151)  iohexol  (OMNIPAQUE ) 350 MG/ML injection 75 mL (75 mLs Intravenous Contrast Given 09/05/24 1153)  fentaNYL  (SUBLIMAZE ) injection 50 mcg (50 mcg Intravenous Given 09/05/24 1158)  fentaNYL  (SUBLIMAZE ) injection 50 mcg (50 mcg Intravenous Given 09/05/24 1200)    FINAL CLINICAL IMPRESSION(S) / ED DIAGNOSES   Final diagnoses:  SAH (subarachnoid hemorrhage) (HCC)     Rx / DC Orders   ED Discharge Orders     None        Note:  This document was prepared using Dragon voice recognition software and may include unintentional dictation errors.   Suzanne Kirsch, MD 09/05/24 1226

## 2024-09-06 ENCOUNTER — Inpatient Hospital Stay (HOSPITAL_COMMUNITY)

## 2024-09-06 DIAGNOSIS — I609 Nontraumatic subarachnoid hemorrhage, unspecified: Secondary | ICD-10-CM | POA: Diagnosis not present

## 2024-09-06 DIAGNOSIS — I671 Cerebral aneurysm, nonruptured: Secondary | ICD-10-CM

## 2024-09-06 LAB — BASIC METABOLIC PANEL WITH GFR
Anion gap: 9 (ref 5–15)
Anion gap: 10 (ref 5–15)
BUN: 17 mg/dL (ref 8–23)
BUN: 23 mg/dL (ref 8–23)
CO2: 20 mmol/L — ABNORMAL LOW (ref 22–32)
CO2: 22 mmol/L (ref 22–32)
Calcium: 8.3 mg/dL — ABNORMAL LOW (ref 8.9–10.3)
Calcium: 8.4 mg/dL — ABNORMAL LOW (ref 8.9–10.3)
Chloride: 108 mmol/L (ref 98–111)
Chloride: 106 mmol/L (ref 98–111)
Creatinine, Ser: 1.25 mg/dL — ABNORMAL HIGH (ref 0.61–1.24)
Creatinine, Ser: 1.25 mg/dL — ABNORMAL HIGH (ref 0.61–1.24)
GFR, Estimated: >60 mL/min (ref >60)
GFR, Estimated: >60 mL/min (ref >60)
Glucose, Bld: 156 mg/dL — ABNORMAL HIGH (ref 70–99)
Glucose, Bld: 129 mg/dL — ABNORMAL HIGH (ref 70–99)
Potassium: 5.5 mmol/L — ABNORMAL HIGH (ref 3.5–5.1)
Potassium: 3.8 mmol/L (ref 3.5–5.1)
Sodium: 137 mmol/L (ref 135–145)
Sodium: 138 mmol/L (ref 135–145)

## 2024-09-06 LAB — GLUCOSE, CAPILLARY
Glucose-Capillary: 114 mg/dL — ABNORMAL HIGH (ref 70–99)
Glucose-Capillary: 116 mg/dL — ABNORMAL HIGH (ref 70–99)

## 2024-09-06 LAB — CBC
HCT: 35.9 % — ABNORMAL LOW (ref 39.0–52.0)
Hemoglobin: 11.6 g/dL — ABNORMAL LOW (ref 13.0–17.0)
MCH: 32.1 pg (ref 26.0–34.0)
MCHC: 32.3 g/dL (ref 30.0–36.0)
MCV: 99.4 fL (ref 80.0–100.0)
Platelets: 251 K/uL (ref 150–400)
RBC: 3.61 MIL/uL — ABNORMAL LOW (ref 4.22–5.81)
RDW: 12.7 % (ref 11.5–15.5)
WBC: 7.3 K/uL (ref 4.0–10.5)
nRBC: 0 % (ref 0.0–0.2)

## 2024-09-06 LAB — PHOSPHORUS: Phosphorus: 3.6 mg/dL (ref 2.5–4.6)

## 2024-09-06 LAB — MAGNESIUM: Magnesium: 2 mg/dL (ref 1.7–2.4)

## 2024-09-06 LAB — TRIGLYCERIDES: Triglycerides: 56 mg/dL (ref <150)

## 2024-09-06 MED ORDER — LEVETIRACETAM 500 MG PO TABS
500.0000 mg | ORAL_TABLET | Freq: Two times a day (BID) | ORAL | Status: AC
  Administered 2024-09-06 – 2024-09-12 (×13): 500 mg via ORAL
  Filled 2024-09-06 (×13): qty 1

## 2024-09-06 MED ORDER — ORAL CARE MOUTH RINSE: 15.0000 mL | OROMUCOSAL | Status: DC | PRN

## 2024-09-06 MED ORDER — HEPARIN SODIUM (PORCINE) 5000 UNIT/ML IJ SOLN
5000.0000 [IU] | Freq: Three times a day (TID) | INTRAMUSCULAR | Status: DC
  Administered 2024-09-06 – 2024-09-19 (×37): 5000 [IU] via SUBCUTANEOUS
  Filled 2024-09-06 (×37): qty 1

## 2024-09-06 MED ORDER — NOREPINEPHRINE 4 MG/250ML-% IV SOLN
0.0000 ug/min | INTRAVENOUS | Status: DC
  Administered 2024-09-06: 2 ug/min via INTRAVENOUS
  Filled 2024-09-06: qty 250

## 2024-09-06 MED ORDER — LACTATED RINGERS IV BOLUS: 500.0000 mL | Freq: Once | INTRAVENOUS | Status: DC

## 2024-09-06 MED ORDER — POLYETHYLENE GLYCOL 3350 17 G PO PACK
17.0000 g | PACK | Freq: Every day | ORAL | Status: DC
  Administered 2024-09-08 – 2024-09-19 (×5): 17 g via ORAL
  Filled 2024-09-06 (×7): qty 1

## 2024-09-06 MED ORDER — DEXMEDETOMIDINE HCL IN NACL 400 MCG/100ML IV SOLN
0.0000 ug/kg/h | INTRAVENOUS | Status: DC
  Administered 2024-09-06: 0.4 ug/kg/h via INTRAVENOUS
  Filled 2024-09-06: qty 100

## 2024-09-06 MED ORDER — ORAL CARE MOUTH RINSE
15.0000 mL | OROMUCOSAL | Status: DC
  Administered 2024-09-06 (×7): 15 mL via OROMUCOSAL

## 2024-09-06 MED ORDER — SODIUM CHLORIDE 0.9 % IV SOLN
250.0000 mL | INTRAVENOUS | Status: AC
  Administered 2024-09-06: 250 mL via INTRAVENOUS

## 2024-09-06 MED ORDER — DOCUSATE SODIUM 50 MG/5ML PO LIQD
100.0000 mg | Freq: Two times a day (BID) | ORAL | Status: DC
  Administered 2024-09-06 – 2024-09-07 (×2): 100 mg via ORAL
  Filled 2024-09-06 (×4): qty 10

## 2024-09-06 NOTE — Progress Notes (Signed)
   Inpatient Rehab Admissions Coordinator :  Per therapy recommendations patient was screened for CIR candidacy by Heron Leavell RN MSN. Patient is not yet at a level to tolerate the intensity required to pursue a CIR admit . Bed level assessments. The CIR admissions team will follow and monitor for progress and place a Rehab Consult order if felt to be appropriate. Please contact me with any questions.  Heron Leavell RN MSN Admissions Coordinator [phone]

## 2024-09-06 NOTE — Progress Notes (Signed)
 Patient extubated per written orders with RT and RN at bedside. Cuff leak present prior to extubation, no stridor noted. Patient placed on 4L [place] and tolerating well at this time.

## 2024-09-06 NOTE — Progress Notes (Signed)
 eLink Physician-Brief Progress Note Patient Name: Clinton Phillips DOB: November 07, 1960 MRN: [id number]   Date of Service  09/06/2024  HPI/Events of Note  KUB reviewed.  eICU Interventions  Okay to use OG tube.        [name] U [name] 09/06/2024, 12:08 AM

## 2024-09-06 NOTE — Progress Notes (Signed)
 Opens eyes to voice.  Follows commands to open and close eyes.  Indicates that he has sensation in both arms with nodding.  He was unable to move his arms or legs.  EVD is draining with ICPs in the 5-10 range.  Radial site is intact.  Overall condition is improved compared with yesterday.

## 2024-09-06 NOTE — TOC CAGE-AID Note (Signed)
 Transition of Care [facility]) - CAGE-AID Screening   Patient Details  Name: Clinton Phillips MRN: [id number] Date of Birth: 1961-04-03  Transition of Care [facility]) CM/SW Contact:    Bynum Mccullars E Ana Woodroof, LCSW Phone Number: 09/06/2024, 9:07 AM   Clinical Narrative: Currently intubated.   CAGE-AID Screening: Substance Abuse Screening unable to be completed due to: : Patient unable to participate

## 2024-09-06 NOTE — TOC CM/SW Note (Signed)
 Transition of Care [facility]) - Inpatient Brief Assessment   Patient Details  Name: Brysyn Brandenberger MRN: [id number] Date of Birth: 07-Jan-1961  Transition of Care [facility]) CM/SW Contact:    Sylar Voong M, RN Phone Number: 09/06/2024, 4:26 PM   Clinical Narrative: Jatavis Malek is a 63 y.o. male who has no significant PMH. He presented to [facility] ED 11/24 with headache that started the night prior around midnight before he went to bed.  He didn't wake up for work morning of 11/24 so family checked on him and found him to be altered.  He was taken to [facility] where [facility] showed acute SAH with concern for Acomm rupture and small volume IVH with mild ventriculomegaly c/w hydrocephalus. 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 Patient extubated this afternoon; await PT/OT and ST evaluations for recommendations. Will follow progress.     Transition of Care Asessment: Insurance and Status: Insurance coverage has been reviewed Patient has primary care physician: No Home environment has been reviewed: Lives with family Prior level of function:: Independent, working Forensic Psychologist: No current home services Social Drivers of Health Review: SDOH reviewed needs interventions Readmission risk has been reviewed: Yes Transition of care needs: transition of care needs identified, TOC will continue to follow   Mliss MICAEL Fass, RN, BSN  Trauma/Neuro ICU Case Manager [phone]

## 2024-09-06 NOTE — Progress Notes (Signed)
 SLP Cancellation Note  Patient Details Name: Haroun Bias MRN: [id number] DOB: [birth year]   Cancelled treatment:       Reason Eval/Treat Not Completed: Patient not medically ready   Maebel Marasco, Consuelo Fitch 09/06/2024, 9:44 AM

## 2024-09-06 NOTE — Evaluation (Signed)
 Occupational Therapy Evaluation Patient Details Name: Clinton Phillips MRN: [id number] DOB: 02-Jun-1961 Today's Date: 09/06/2024   History of Present Illness   63 yo male presents to [facility] from [facility] on 11/24 with headache, AMS. CTH showed acute SAH with concern for Acomm rupture and small volume IVH with mild ventriculomegaly c/w hydrocephalus. EVD 11/24. ETT 09/05/1124. PMHx: none.     Clinical Impressions RN clamped EVD at the start of the session, RN unclamped EVD at the end of the session. Clinton Phillips was evaluated s/p the above admission list. 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 Upon evaluation the pt was limited by significantly impaired cognition. Overall he was A&Ox0, did not follow many commands, echoed most questions/comments, was labile and defiant. Pt did get to the EOB with min A, demonstrated good sitting balance and needed mod A to get back into bed due to lack of participation. Due to the deficits listed below the pt will need total A for all aspects of care at bed level. Anticipate he has the strength, coordination, and ROM to assist with functional tasks, however pt was unable to demonstrate ability on evaluation despite therapists best efforts for education, reasoning and coaxing. Pt will benefit from continued acute OT services and intensive inpatient follow up therapy, >3 hours/day after discharge.       If plan is discharge home, recommend the following:   A lot of help with walking and/or transfers;A lot of help with bathing/dressing/bathroom;Assistance with cooking/housework;Assist for transportation     Functional Status Assessment   Patient has had a recent decline in their functional status and demonstrates the ability to make significant improvements in function in a reasonable and predictable amount of time.     Equipment Recommendations   Other (comment) (defer)     Recommendations for Other Services   Rehab  consult     Precautions/Restrictions   Precautions Precautions: Fall Recall of Precautions/Restrictions: Impaired Precaution/Restrictions Comments: EVD Restrictions Weight Bearing Restrictions Per Provider Order: No     Mobility Bed Mobility Overal bed mobility: Needs Assistance Bed Mobility: Supine to Sit, Sit to Supine     Supine to sit: Min assist Sit to supine: Mod assist   General bed mobility comments: significant cues and coaxing needed, pt educated on reason for bed mobility and importance. pt needed min A to bring R hip forward. mod A needed to bring legs back into bed due to poor participation/impiared cognition    Transfers Overall transfer level: Needs assistance                 General transfer comment: pt refused      Balance Overall balance assessment: Needs assistance Sitting-balance support: Feet supported Sitting balance-Leahy Scale: Good                                     ADL either performed or assessed with clinical judgement   ADL Overall ADL's : Needs assistance/impaired                                       General ADL Comments: total A for all aspects of care due to pt defiance, impaired cognition and unwillingness to participate on evaluation. Anticipate pt could functionally participate with minimal assist in most of his ADLs - unable  to fully use clinical judgement based on cognition/affect presentation.     Vision Baseline Vision/History: 0 No visual deficits Vision Assessment?: No apparent visual deficits Additional Comments: unable to assess     Perception Perception: Within Functional Limits       Praxis Praxis: [facility]       Pertinent Vitals/Pain Pain Assessment Pain Assessment: Faces Faces Pain Scale: Hurts a little bit Pain Location: pt perseverating on pain with any physical touch Pain Descriptors / Indicators: Discomfort, Grimacing, Guarding Pain Intervention(s): Limited activity  within patient's tolerance, Monitored during session     Extremity/Trunk Assessment Upper Extremity Assessment Upper Extremity Assessment: Overall WFL for tasks assessed;Difficult to assess due to impaired cognition (demonstrated full ROM, strength also seemed [facility]. Pt refusing assessment)   Lower Extremity Assessment Lower Extremity Assessment: Defer to PT evaluation   Cervical / Trunk Assessment Cervical / Trunk Assessment: Normal   Communication Communication Communication: No apparent difficulties   Cognition Arousal: Alert Behavior During Therapy: Lability Cognition: Cognition impaired   Orientation impairments: Person, Time, Place, Situation Awareness: Intellectual awareness impaired Memory impairment (select all impairments): Short-term memory, Working civil service fast streamer, Non-declarative long-term memory, Geneticist, Molecular long-term memory Attention impairment (select first level of impairment): Focused attention Executive functioning impairment (select all impairments): Initiation, Organization, Sequencing, Reasoning, Problem solving OT - Cognition Comments: cognition singificantly impaired. pt did not answer any orientation questions. he echoed questions/phrases (deflecting?), he was extremely defiant to any command, often doign to oppopsite of what was asked.                 Following commands: Impaired Following commands impaired: Follows one step commands inconsistently     Cueing  General Comments   Cueing Techniques: Verbal cues;Gestural cues  EVD clamped by RN at the start of the session, unclamped at the end. VSS on RA. 2L placed back on pt at the end of the session   Exercises     Shoulder Instructions      Home Living Family/patient expects to be discharged to:: Private residence Living Arrangements: Spouse/significant other (fiance stephanie)   Type of Home: House Home Access: Stairs to enter [employer] of Steps: 4 Entrance Stairs-Rails:  None Home Layout: One level     Bathroom Shower/Tub: Tub/shower unit;Walk-in shower   Bathroom Toilet: Standard     Home Equipment: [place] - [employer] (pt's church has other DME if needed)          Prior Functioning/Environment Prior Level of Function : Independent/Modified Independent               ADLs Comments: indep, drives, works    OT Problem List: Decreased range of motion;Decreased activity tolerance;Impaired balance (sitting and/or standing);Decreased cognition;Decreased safety awareness;Decreased knowledge of use of DME or AE;Decreased knowledge of precautions   OT Treatment/Interventions: Self-care/ADL training;Therapeutic exercise;DME and/or AE instruction;Therapeutic activities;Cognitive remediation/compensation;Patient/family education;Balance training      OT Goals(Current goals can be found in the care plan section)   Acute Rehab OT Goals Patient Stated Goal: to not go home OT Goal Formulation: With patient Time For Goal Achievement: 09/20/24 Potential to Achieve Goals: Good ADL Goals Pt Will Perform Grooming: with supervision;standing Pt Will Perform Upper Body Dressing: with supervision Pt Will Perform Lower Body Dressing: with supervision;sit to/from stand Pt Will Transfer to Toilet: with supervision;ambulating Additional ADL Goal #1: Pt will indep sequence through a basic ADL task   OT Frequency:  Min 2X/week    Co-evaluation PT/OT/SLP Co-Evaluation/Treatment: Yes Reason for Co-Treatment: Complexity of the  patient's impairments (multi-system involvement);For patient/therapist safety;To address functional/ADL transfers   OT goals addressed during session: ADL's and self-care      AM-PAC OT 6 Clicks Daily Activity     Outcome Measure Help from another person eating meals?: Total Help from another person taking care of personal grooming?: Total Help from another person toileting, which includes using toliet, bedpan, or urinal?:  Total Help from another person bathing (including washing, rinsing, drying)?: Total Help from another person to put on and taking off regular upper body clothing?: Total Help from another person to put on and taking off regular lower body clothing?: Total 6 Click Score: 6   End of Session Nurse Communication: Mobility status  Activity Tolerance: Patient tolerated treatment well Patient left: in bed;with call bell/phone within reach;with bed alarm set;with family/visitor present  OT Visit Diagnosis: Unsteadiness on feet (R26.81);Other abnormalities of gait and mobility (R26.89);Muscle weakness (generalized) (M62.81);Pain                Time: 8465-8397 OT Time Calculation (min): 28 min Charges:  OT General Charges $OT Visit: 1 Visit OT Evaluation $OT Eval Moderate Complexity: 1 Mod  Sam   [name] 09/06/2024, 4:25 PM

## 2024-09-06 NOTE — Progress Notes (Signed)
 NAME:  Clinton Phillips, MRN:  [id number], DOB:  1960-11-03, LOS: 1 ADMISSION DATE:  09/05/2024, CONSULTATION DATE:  09/05/24 REFERRING MD:  Suzanne - [facility] CHIEF COMPLAINT:  Headache   History of Present Illness:  Pt is encephelopathic; therefore, this HPI is obtained from chart review. Clinton Phillips is a 63 y.o. male who has no significant PMH. He presented to [facility] ED 11/24 with headache that started the night prior around midnight before he went to bed.  He didn't wake up for work morning of 11/24 so family checked on him and found him to be altered.  He was taken to [facility] where [facility] showed acute SAH with concern for Acomm rupture and small volume IVH with mild ventriculomegaly c/w hydrocephalus. 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  Case was discussed with neurosurgery who recommended emergent transfer to [facility] ICU for further evaluation and management. Prior to transfer to [facility], he was intubated for airway protection.  Pertinent  Medical History:  has SAH (subarachnoid hemorrhage) (HCC) on their problem list.  Significant Hospital Events: Including procedures, antibiotic start and stop dates in addition to other pertinent events   11/24 admit, underwent EVD placement and aneurysmal coiling   Interim History / Subjective:  Off sedation becomes agitated, not following commands and tachypneic.  Objective:  Blood pressure 105/63, pulse (!) 59, temperature 98.2 F (36.8 C), temperature source Oral, resp. rate 18, SpO2 96%.    Vent Mode: PRVC FiO2 (%):  [40 %] 40 % Set Rate:  [18 bmp] 18 bmp Vt Set:  [500 mL-640 mL] 640 mL PEEP:  [5 cmH20-8 cmH20] 5 cmH20 Pressure Support:  [5 cmH20] 5 cmH20 Plateau Pressure:  [18 cmH20-20 cmH20] 18 cmH20   Intake/Output Summary (Last 24 hours) at 09/06/2024 0824 Last data filed at 09/06/2024 0700 Gross per 24 hour  Intake 1337.65 ml  Output 2123 ml  Net -785.35 ml    There were no vitals filed for this visit.   Physical  Exam: General: Adult male, 1 sedated he is unresponsive, when sedation is weaned off he becomes agitated and not following commands Neuro: Able to move all extremities, not following commands, opens eyes to stimulation HEENT: [place]/AT. Sclerae anicteric. ETT in place. Cardiovascular: RRR, no M/R/G.  Lungs: Tachypneic when awake otherwise clear to auscultation Abdomen: BS x 4, soft, NT/ND.  Musculoskeletal: No gross deformities, no edema.    Labs/imaging personally reviewed:  CT head 11/24 > acute SAH with concern for Acom rupture and small volume IVH with mild ventriculomegaly c/w hydrocephalus. CTA head 11/24 >  small Acomm aneurysm.  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  Will try again later today  Cardiac/Vascular  No issues  GI Diet: Start tube feeds GI PPX: Protonix   ID No issues  Renal  Mild rising creatinine and potassium likely due to dehydration 500 LR given  Endo Q4h fingersticks   Heme/Onc DVT ppx : SCD   MSK/other   No issues    Labs   CBC: Recent Labs  Lab 09/05/24 1101 09/05/24 1739 09/05/24 1834 09/06/24 0456  WBC 7.1  --   --  7.3  HGB 12.0* 10.5* 11.6* 11.6*  HCT 35.5* 31.0* 34.0* 35.9*  MCV 95.9  --   --  99.4  PLT 222  --   --  251    Basic Metabolic Panel: Recent Labs  Lab 09/05/24 1101 09/05/24 1739 09/05/24 1834 09/05/24 1930 09/06/24 0456  NA 138 139 140 141 137  K 4.1 3.7 4.3 3.8 5.5*  CL 106  --   --  107 108  CO2 23  --   --  22 20*  GLUCOSE 141*  --   --  135* 156*  BUN 11  --   --  9  17  CREATININE 1.10  --   --  1.15 1.25*  CALCIUM 8.8*  --   --  8.6* 8.3*  MG  --   --   --   --  2.0  PHOS  --   --   --   --  3.6   GFR: Estimated Creatinine Clearance: 75.7 mL/min (A) (by C-G formula based on SCr of 1.25 mg/dL (H)). Recent Labs  Lab 09/05/24 1101 09/06/24 0456  WBC 7.1 7.3  LATICACIDVEN 2.2*  --     Liver Function Tests: No results for input(s): AST, ALT, ALKPHOS, BILITOT, PROT, ALBUMIN in the last 168 hours. No results for input(s): LIPASE, AMYLASE in the last 168 hours. No results for input(s): AMMONIA in the last 168 hours.  ABG    Component Value Date/Time   PHART 7.396 09/05/2024 1834   PCO2ART 40.9 09/05/2024 1834   PO2ART 288 (H) 09/05/2024 1834   HCO3 25.0 09/05/2024 1834   TCO2 26 09/05/2024 1834   ACIDBASEDEF 3.0 (H) 09/05/2024 1739   O2SAT 100 09/05/2024 1834     Coagulation Profile: Recent Labs  Lab 09/05/24 1101  INR 1.0    Cardiac Enzymes: No results for input(s): CKTOTAL, CKMB, CKMBINDEX, TROPONINI in the last 168 hours.  HbA1C: No results found for: HGBA1C  CBG: No results for input(s): GLUCAP in the last 168 hours.  Review of Systems:   Unable to obtain as pt is encephalopathic.  Past Medical History:  He,  has a past medical history of Anemia.   Surgical History:  No past surgical history on file.   Social History:   reports that he has been smoking cigarettes. He has a 23 pack-year smoking history. He has never used smokeless tobacco. He reports current alcohol use of about 12.0 standard drinks of alcohol per week. He reports current drug use. Drug: Marijuana.   Family History:  His family history is not on file.   Allergies No Known Allergies   Home Medications  Prior to Admission medications   Medication Sig Start Date End Date Taking? Authorizing Provider  cyclobenzaprine  (FLEXERIL ) 5 MG tablet Take 1-2 tablets (5-10 mg total) by mouth 3 (three) times daily as needed for  muscle spasms. Patient not taking: Reported on 09/05/2024 03/18/23   Charlene Debby BROCKS, PA-C  cyclobenzaprine  (FLEXERIL ) 5 MG tablet Take 1-2 tablets (5-10 mg total) by mouth 3 (three) times daily as needed for muscle spasms. Patient not taking: Reported on 09/05/2024 03/18/23   Gaines, Thomas C, PA-C  HYDROcodone -acetaminophen  (NORCO) 5-325 MG tablet Take 1 tablet by mouth every 6 (six) hours as needed for moderate pain. Patient not taking: Reported on 09/05/2024 03/18/23   Gaines, Thomas C, PA-C  ondansetron  (ZOFRAN  ODT) 4 MG disintegrating tablet Take 1 tablet (4 mg total) by mouth every 8 (eight) hours as needed for nausea  or vomiting. Patient not taking: Reported on 09/05/2024 01/05/21   Angelena Smalls, MD  predniSONE  (DELTASONE ) 10 MG tablet Take 1 tablet (10 mg total) by mouth daily. 6,5,4,3,2,1 six day taper Patient not taking: Reported on 09/05/2024 03/18/23   Charlene Debby BROCKS, PA-C    The patient is critically ill due to subarachnoid hemorrhage and acute hypoxic respiratory failure.  Critical care was necessary to treat or prevent imminent or life-threatening deterioration. Critical care time was spent by me on the following activities: development of a treatment plan with the patient and/or surrogate as well as nursing, discussions with consultants, evaluation of the patient's response to treatment, examination of the patient, obtaining a history from the patient or surrogate, ordering and performing treatments and interventions, ordering and review of laboratory studies, ordering and review of radiographic studies, review of telemetry data including pulse oximetry, re-evaluation of patient's condition and participation in multidisciplinary rounds.   I personally spent 43 minutes providing critical care not including any separately billable procedures.   Zola LOISE Herter, MD [facility] 09/06/2024 8:32 AM

## 2024-09-06 NOTE — Progress Notes (Signed)
 eLink Physician-Brief Progress Note Patient Name: Clinton Phillips DOB: [birth year] MRN: [id number]   Date of Service  09/06/2024  HPI/Events of Note  BP is running lower than neurosurgery's goal BP, heart rate 60's.  eICU Interventions  Peripheral Levo gtt ordered to maintain SBP goal of 120 - 150        Deante Blough U Birdena Kingma 09/06/2024, 2:59 AM

## 2024-09-06 NOTE — Progress Notes (Signed)
 Initial Nutrition Assessment  DOCUMENTATION CODES:   Not applicable  INTERVENTION:  If pt unable to pass swallow eval, initiate tube feeding via OG: initiate at 30 ml and increase 10 ml q6h until goal rate reached Osmolite 1.5 at 60 ml/h (1440 ml per day) Prosource TF20 60 ml daily Provides 2240 kcal, 110 gm protein, 1097 ml free water daily  Monitor for diet advancements  NUTRITION DIAGNOSIS:   Inadequate oral intake related to inability to eat as evidenced by NPO status.  GOAL:   Patient will meet greater than or equal to 90% of their needs  MONITOR:   TF tolerance, Diet advancement  REASON FOR ASSESSMENT:   Consult Enteral/tube feeding initiation and management  ASSESSMENT:   Pt with no significant PMH presented to [facility] ED with AMS, CT showed acute SAH and small Acomm aneurysm.  11/24 admitted, intubated 11/25 extubated  Pt discussed during ICU rounds with MD and RN. If pt unable to pass swallow eval today, MD requests tube feeds to be initiated. Will leave recommendations in interventions.   Pt was intubated at time of assessment, but is now extubated. Pt's fiance at bedside during assessment. Fiance reports pt ate very well PTA and did not avoid any specific foods.  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 Pt's physical exam shows adequate muscle and fat storage, appears well nourished at baseline.    Admit/current weight: 104.3 kg    Intake/Output Summary (Last 24 hours) at 09/06/2024 1521 Last data filed at 09/06/2024 1500 Gross per 24 hour  Intake 1414.91 ml  Output 2711 ml  Net -1296.09 ml   Net IO Since Admission: -1,296.09 mL  [09/06/24 1521]  Drains/Lines: EVD: 168 ml x 24 h A line L radial Urethral Catheter: UOP 1925 ml x 24 h OG gastric per xray EBL: 30 ml x 24 h   Nutritionally Relevant Medications: Scheduled Meds:  docusate  100 mg Per Tube BID   fentaNYL  (SUBLIMAZE ) injection  25-50 mcg Intravenous Once   levETIRAcetam   500 mg Per Tube BID   niMODipine   60 mg Per Tube Q4H   pantoprazole  (PROTONIX ) IV  40 mg Intravenous Daily   polyethylene glycol  17 g Per Tube Daily   Continuous Infusions:  sodium chloride  Stopped (09/06/24 0424)   fentaNYL  infusion INTRAVENOUS 200 mcg/hr (09/06/24 0655)   lactated ringers      midazolam  Stopped (09/05/24 1522)   norepinephrine  (LEVOPHED ) Adult infusion 8 mcg/min (09/06/24 0655)   propofol  (DIPRIVAN ) infusion 50 mcg/kg/min (09/06/24 0655)    Labs Reviewed: Potassium 5.5<--3.8 BUN 156<--135 Creatinine 1.25<--1.15 No CBG or A1c    NUTRITION - FOCUSED PHYSICAL EXAM:  Flowsheet Row Most Recent Value  Orbital Region No depletion  Upper Arm Region Unable to assess  Thoracic and Lumbar Region Unable to assess  Buccal Region No depletion  Temple Region Mild depletion  Clavicle Bone Region Unable to assess  Clavicle and Acromion Bone Region Unable to assess  Scapular Bone Region No depletion  Dorsal Hand Unable to assess  Patellar Region No depletion  Anterior Thigh Region No depletion  Posterior Calf Region No depletion  Edema (RD Assessment) Mild  [upper extremities]  Hair Reviewed  Eyes Unable to assess  Mouth Unable to assess  Skin Reviewed  Nails Reviewed    Diet Order:   Diet Order             Diet NPO time specified  Diet effective now                   EDUCATION NEEDS:   Not appropriate for education at this time  Skin:  Skin Assessment: Reviewed RN Assessment  Last BM:  PTA  Height:   Ht Readings from Last 1 Encounters:  09/05/24 6' 1 (1.854 m)    Weight:   Wt Readings from Last 1 Encounters:  09/05/24 104.3 kg     Ideal Body Weight:  83.6 kg  BMI:  There is no height or weight on file to calculate BMI.  Estimated Nutritional Needs:   Kcal:  2100-2300  Protein:  105-125g  Fluid:  > 2L    Josette Glance, MS, RDN, LDN Clinical Dietitian I Please reach out via secure chat

## 2024-09-06 NOTE — Plan of Care (Signed)

## 2024-09-06 NOTE — Evaluation (Signed)
 Physical Therapy Evaluation Patient Details Name: Clinton Phillips MRN: [id number] DOB: [birth year] Today's Date: 09/06/2024  History of Present Illness  63 yo male presents to [facility] from [facility] on 11/24 with headache, AMS. CTH showed acute SAH with concern for Acomm rupture and small volume IVH with mild ventriculomegaly c/w hydrocephalus. EVD 11/24. ETT 11/24-11/25. PMHx: none.  Clinical Impression   Pt presents with little to no command following, A&Ox0 on assessment, difficulty mobilizing to/from EOB, and inability to progress OOB given cognition vs weakness. Pt to benefit from acute PT to address deficits. Pt requiring min-mod assist for bed mobility, once EOB pt refused help and then states I can stand, but I'm not going to. Pt with oppositional/defiant presentation, per pt's wife pt is a joking person at baseline but anticipate his presentation today is not baseline. Pt echoing most questions asked to him back to therapists. Patient will benefit from intensive inpatient follow-up therapy, >3 hours/day. PT to progress mobility as tolerated, and will continue to follow acutely.          If plan is discharge home, recommend the following: A lot of help with walking and/or transfers;A lot of help with bathing/dressing/bathroom   Can travel by private vehicle        Equipment Recommendations None recommended by PT  Recommendations for Other Services       Functional Status Assessment Patient has had a recent decline in their functional status and demonstrates the ability to make significant improvements in function in a reasonable and predictable amount of time.     Precautions / Restrictions Precautions Precautions: Fall Recall of Precautions/Restrictions: Impaired Precaution/Restrictions Comments: EVD (clamped during session and RN to room at end of session to unclamp) Restrictions Weight Bearing Restrictions Per Provider Order: No      Mobility  Bed  Mobility Overal bed mobility: Needs Assistance Bed Mobility: Supine to Sit, Sit to Supine     Supine to sit: Min assist Sit to supine: Mod assist, +2 for physical assistance   General bed mobility comments: significant cues and coaxing needed, pt educated on reason for bed mobility and importance. pt needed min A to bring R hip forward. mod A needed to bring legs back into bed due to poor participation/impiared cognition    Transfers                   General transfer comment: pt refused    Ambulation/Gait                  Stairs            Wheelchair Mobility     Tilt Bed    Modified Rankin (Stroke Patients Only)       Balance Overall balance assessment: Needs assistance Sitting-balance support: Feet supported Sitting balance-Leahy Scale: Good                                       Pertinent Vitals/Pain Pain Assessment Pain Assessment: Faces Faces Pain Scale: Hurts little more Pain Location: pt perseverating on pain with any physical touch Pain Descriptors / Indicators: Discomfort, Grimacing, Guarding Pain Intervention(s): Limited activity within patient's tolerance, Monitored during session, Repositioned    Home Living Family/patient expects to be discharged to:: Private residence Living Arrangements: Spouse/significant other (fiance stephanie)   Type of Home: House Home Access: Stairs to enter Entrance Stairs-Rails: None Entrance Stairs-Number of Steps:  4   Home Layout: One level Home Equipment: Cane - single point (pt's church has other DME if needed)      Prior Function Prior Level of Function : Independent/Modified Independent             Mobility Comments: indep ADLs Comments: indep, drives, works in Adult Nurse Assessment   Upper Extremity Assessment Upper Extremity Assessment: Defer to OT evaluation    Lower Extremity Assessment Lower Extremity Assessment: Difficult to assess  due to impaired cognition (attempted to do strength screen but pt not following commands to do so, at least has functional strength to lift bilat LEs off bed, move legs over EOB)    Cervical / Trunk Assessment Cervical / Trunk Assessment: Normal  Communication   Communication Communication: No apparent difficulties    Cognition Arousal: Alert Behavior During Therapy: Lability   PT - Cognitive impairments: Difficult to assess, Awareness, Memory, Attention, Sequencing, Problem solving, Safety/Judgement Difficult to assess due to:  (repeating all questions back to PT)                     PT - Cognition Comments: pt A&Ox0, whenever asked an orientation question pt asks it back to PT while smiling. Pt did not know fiances name at bedside. Presents almost as defiant, behavioral with command following as pt often does the opposite of what is asked, per fiance pt has a joking personality but anticipate this presentation is not baseline. Following commands: Impaired Following commands impaired: Follows one step commands inconsistently     Cueing Cueing Techniques: Verbal cues, Gestural cues     General Comments General comments (skin integrity, edema, etc.): EVD clamped by RN at the start of the session, unclamped at the end. VSS on RA. 2L placed back on pt at the end of the session    Exercises     Assessment/Plan    PT Assessment Patient needs continued PT services  PT Problem List Decreased strength;Decreased balance;Decreased activity tolerance;Decreased coordination;Decreased safety awareness;Decreased mobility;Decreased cognition;Decreased knowledge of precautions       PT Treatment Interventions DME instruction;Therapeutic activities;Therapeutic exercise;Gait training;Patient/family education;Balance training;Stair training;Functional mobility training;Neuromuscular re-education    PT Goals (Current goals can be found in the Care Plan section)  Acute Rehab PT  Goals Patient Stated Goal: home PT Goal Formulation: With patient Time For Goal Achievement: 09/20/24 Potential to Achieve Goals: Good    Frequency Min 2X/week     Co-evaluation PT/OT/SLP Co-Evaluation/Treatment: Yes Reason for Co-Treatment: Complexity of the patient's impairments (multi-system involvement);For patient/therapist safety;To address functional/ADL transfers PT goals addressed during session: Mobility/safety with mobility;Balance OT goals addressed during session: ADL's and self-care       AM-PAC PT 6 Clicks Mobility  Outcome Measure Help needed turning from your back to your side while in a flat bed without using bedrails?: A Little Help needed moving from lying on your back to sitting on the side of a flat bed without using bedrails?: A Lot Help needed moving to and from a bed to a chair (including a wheelchair)?: Total Help needed standing up from a chair using your arms (e.g., wheelchair or bedside chair)?: Total Help needed to walk in hospital room?: Total Help needed climbing 3-5 steps with a railing? : Total 6 Click Score: 9    End of Session   Activity Tolerance: Patient limited by fatigue;Other (comment) (cognition) Patient left: in bed;with call bell/phone within reach;with bed alarm set;with nursing/sitter in room;Other (comment);with family/visitor  present (RN unclamping EVD at bedside) Nurse Communication: Mobility status PT Visit Diagnosis: Other abnormalities of gait and mobility (R26.89);Muscle weakness (generalized) (M62.81)    Time: 8465-8397 PT Time Calculation (min) (ACUTE ONLY): 28 min   Charges:   PT Evaluation $PT Eval Low Complexity: 1 Low   PT General Charges $$ ACUTE PT VISIT: 1 Visit         Johana RAMAN, PT DPT Acute Rehabilitation Services Secure Chat Preferred  Office [phone]   Delano Scardino FOR[place] 09/06/2024, 5:19 PM

## 2024-09-06 NOTE — Progress Notes (Signed)
 Transcranial Doppler  Date POD PCO2 HCT BP  MCA ACA PCA OPHT SIPH VERT Basilar  11/25 1  35.9 96/68 Right  Left   69  44   *  -22   16  18   15  11    *  *   -14  -23   -38           Right  Left                                            Right  Left                                             Right  Left                                             Right  Left                                            Right  Left                                            Right  Left                                        MCA = Middle Cerebral Artery      OPHT = Opthalmic Artery     BASILAR = Basilar Artery   ACA = Anterior Cerebral Artery     SIPH = Carotid Siphon PCA = Posterior Cerebral Artery   VERT = Verterbral Artery                   Normal MCA = 62+\-12 ACA = 50+\-12 PCA = 42+\-23   Right Lindegaard Ratio: 2.65 Left Lindegaard Ratio: 1.57   Results can be found under chart review under CV PROC. 09/06/2024 12:30 PM Lillah Standre RVT, RDMS

## 2024-09-06 NOTE — Anesthesia Postprocedure Evaluation (Signed)
 Anesthesia Post Note  Patient: Clinton Phillips  Procedure(s) Performed: RADIOLOGY WITH ANESTHESIA     Patient location during evaluation: SICU Anesthesia Type: General Level of consciousness: sedated Pain management: pain level controlled Vital Signs Assessment: post-procedure vital signs reviewed and stable Respiratory status: patient remains intubated per anesthesia plan Cardiovascular status: stable Postop Assessment: no apparent nausea or vomiting Anesthetic complications: no   No notable events documented.              Sahmya Arai D Clinton Phillips

## 2024-09-06 NOTE — Progress Notes (Signed)
 OT Cancellation Note  Patient Details Name: Clinton Phillips MRN: [id number] DOB: August 15, 1961   Cancelled Treatment:    Reason Eval/Treat Not Completed: Active bedrest order (OT to follow up once activity orders progress.)  [name] 09/06/2024, 8:08 AM

## 2024-09-07 ENCOUNTER — Inpatient Hospital Stay (HOSPITAL_COMMUNITY)

## 2024-09-07 DIAGNOSIS — I609 Nontraumatic subarachnoid hemorrhage, unspecified: Secondary | ICD-10-CM

## 2024-09-07 LAB — BASIC METABOLIC PANEL WITH GFR
Anion gap: 8 (ref 5–15)
BUN: 18 mg/dL (ref 8–23)
CO2: 24 mmol/L (ref 22–32)
Calcium: 8.3 mg/dL — ABNORMAL LOW (ref 8.9–10.3)
Chloride: 103 mmol/L (ref 98–111)
Creatinine, Ser: 1.12 mg/dL (ref 0.61–1.24)
GFR, Estimated: >60 mL/min (ref >60)
Glucose, Bld: 95 mg/dL (ref 70–99)
Potassium: 3.9 mmol/L (ref 3.5–5.1)
Sodium: 135 mmol/L (ref 135–145)

## 2024-09-07 LAB — GLUCOSE, CAPILLARY
Glucose-Capillary: 93 mg/dL (ref 70–99)
Glucose-Capillary: 89 mg/dL (ref 70–99)
Glucose-Capillary: 140 mg/dL — ABNORMAL HIGH (ref 70–99)
Glucose-Capillary: 124 mg/dL — ABNORMAL HIGH (ref 70–99)
Glucose-Capillary: 157 mg/dL — ABNORMAL HIGH (ref 70–99)
Glucose-Capillary: 94 mg/dL (ref 70–99)

## 2024-09-07 LAB — CBC
HCT: 33 % — ABNORMAL LOW (ref 39.0–52.0)
Hemoglobin: 11.2 g/dL — ABNORMAL LOW (ref 13.0–17.0)
MCH: 32.9 pg (ref 26.0–34.0)
MCHC: 33.9 g/dL (ref 30.0–36.0)
MCV: 97.1 fL (ref 80.0–100.0)
Platelets: 205 K/uL (ref 150–400)
RBC: 3.4 MIL/uL — ABNORMAL LOW (ref 4.22–5.81)
RDW: 12.7 % (ref 11.5–15.5)
WBC: 8.7 K/uL (ref 4.0–10.5)
nRBC: 0 % (ref 0.0–0.2)

## 2024-09-07 LAB — MAGNESIUM: Magnesium: 1.9 mg/dL (ref 1.7–2.4)

## 2024-09-07 LAB — PHOSPHORUS: Phosphorus: 3.3 mg/dL (ref 2.5–4.6)

## 2024-09-07 MED ORDER — ORAL CARE MOUTH RINSE: 15.0000 mL | OROMUCOSAL | Status: DC | PRN

## 2024-09-07 NOTE — Plan of Care (Signed)
  Problem: Clinical Measurements: Goal: Ability to maintain clinical measurements within normal limits will improve Outcome: Progressing   Problem: Nutrition: Goal: Adequate nutrition will be maintained Outcome: Progressing   Problem: Safety: Goal: Ability to remain free from injury will improve Outcome: Progressing   Problem: Skin Integrity: Goal: Risk for impaired skin integrity will decrease Outcome: Progressing   Problem: Respiratory: Goal: Ability to maintain a clear airway and adequate ventilation will improve Outcome: Progressing

## 2024-09-07 NOTE — Progress Notes (Signed)
 Nutrition Brief Note  RD completed nutrition assessment 1/25. Pt now extubated and diet advanced to Heart Healthy.  Spoke with RN who reports pt is eating and has ordered meal.   Current diet order is Heart Healthy. Labs and medications reviewed.   1/24 - admit s/p EVD, coiling 1/25 - extubated  Liberalize diet to Regular Encourage PO intake to meet nutrition needs.  Changed pt to room service with assist while in the ICU.  Please re-consult as needed.   Powell SQUIBB., RD, LDN, CNSC See AMiON for contact information

## 2024-09-07 NOTE — Progress Notes (Addendum)
 Transcranial Doppler   Date POD PCO2 HCT BP   MCA ACA PCA OPHT SIPH VERT Basilar  11/25 1   35.9 96/68 Right  Left   69  44   *  -22   16  18   15  11    *  *   -14  -23   -38       11/26   2    33 121/78  Right  Left    70   53    *   -22    21   23    17   16     *   *    -24   -23    -37                 Right  Left                                                                 Right  Left                                                                 Right  Left                                                               Right  Left                                                               Right  Left                                                       MCA = Middle Cerebral Artery      OPHT = Opthalmic Artery     BASILAR = Basilar Artery   ACA = Anterior Cerebral Artery     SIPH = Carotid Siphon PCA = Posterior Cerebral Artery   VERT = Verterbral Artery                    Normal MCA = 62+\-12 ACA = 50+\-12 PCA = 42+\-23    Right Lindegaard Ratio: 2.26 Left Lindegaard Ratio: 2.04   Results can be found under chart review under CV PROC. 09/07/2024 3:42 PM Al Gagen RVT, RDMS

## 2024-09-07 NOTE — Progress Notes (Signed)
 Extubated Awake and alert Follows all commands Speech fluent Visual fields full 5/5 grip strength Lifts both legs with normal strength  He is at high risk for spasm given size of SAH.  Allow to autoregulate BP as long as neuro exam is intact.  BP goal <200  Start TCDs

## 2024-09-07 NOTE — Progress Notes (Signed)
   Inpatient Rehabilitation Admissions Coordinator   I will place rehab consult to assess for candidacy for possible CIR admit.  Heron Leavell, RN, MSN Rehab Admissions Coordinator [phone] 09/07/2024 5:27 PM

## 2024-09-07 NOTE — Progress Notes (Signed)
 Occupational Therapy Treatment Patient Details Name: Clinton Phillips MRN: [id number] DOB: [birth year] Today's Date: 09/07/2024   History of present illness 63 yo male presents to [facility] from [facility] on 11/24 with headache, AMS. CTH showed acute SAH with concern for Acomm rupture and small volume IVH with mild ventriculomegaly c/w hydrocephalus. EVD 11/24. ETT 11/24-11/25. PMHx: none.   OT comments  Pt with improved ability to participate with therapy today. Distracted at times however able to easily redirect to task. Due to amount of cuing for transfer, pt mod A + 2 for stepping to chair. Overall Mod A with ADL tasks due to below listed deficits. Fiance present and supportive throughout session - she is asking Clinton Phillips to stop joking around. 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 Continue to recommend  intensive inpatient follow-up therapy, >3 hours/day to maximize functional level of independence. VSS; EVD clamped at beginning of session/nsg aware pt in chair at end of session. Acute OT to follow.       If plan is discharge home, recommend the following:  A lot of help with walking and/or transfers;A lot of help with bathing/dressing/bathroom;Assistance with cooking/housework;Assist for transportation   Equipment Recommendations  Other (comment) (defer)    Recommendations for Other Services Rehab consult    Precautions / Restrictions Precautions Precautions: Fall Recall of Precautions/Restrictions: Impaired Precaution/Restrictions Comments: EVD (clamped during session and RN to room at end of session to unclamp) Restrictions Weight Bearing Restrictions Per Provider Order: No       Mobility Bed Mobility Overal bed mobility: Needs Assistance Bed Mobility: Supine to Sit     Supine to sit: Min assist, +2 for safety/equipment          Transfers Overall transfer level: Needs assistance Equipment used: 2 person hand  held assist Transfers: Sit to/from Stand, Bed to chair/wheelchair/BSC Sit to Stand: Min assist, +2 safety/equipment     Step pivot transfers: Mod assist, +2 safety/equipment     General transfer comment: distracted; able to redirect; mall shuffling steps to chair     Balance Overall balance assessment: Needs assistance Sitting-balance support: Feet supported Sitting balance-Leahy Scale: Good       Standing balance-Leahy Scale: Poor                             ADL either performed or assessed with clinical judgement   ADL Overall ADL's : Needs assistance/impaired Eating/Feeding: Set up Eating/Feeding Details (indicate cue type and reason): girlfriend feeding him - educated on importance of him completing tasks on his own Grooming: Set up;Supervision/safety;Sitting   Upper Body Bathing: Minimal assistance;Sitting   Lower Body Bathing: Maximal assistance;Sit to/from stand   Upper Body Dressing : Moderate assistance;Sitting   Lower Body Dressing: Maximal assistance;Sit to/from stand   Toilet Transfer: Moderate assistance;+2 for safety/equipment (simlated)   Toileting- Clothing Manipulation and Hygiene: Maximal assistance       Functional mobility during ADLs: Moderate assistance;+2 for physical assistance      Extremity/Trunk Assessment Upper Extremity Assessment Upper Extremity Assessment: Overall WFL for tasks assessed (will further assess)   Lower Extremity Assessment Lower Extremity Assessment: Defer to PT evaluation        Vision   Vision Assessment?: Wears glasses for reading Additional Comments: no copmlaints of visual changes; decreased visual attention noted   Perception     Praxis Praxis Praxis: WFL (midline withR bias; will further assess)  Communication Communication Communication: No apparent difficulties   Cognition Arousal: Alert Behavior During Therapy: Restless, Impulsive Cognition: Cognition impaired   Orientation  impairments: Time, Situation Awareness: Intellectual awareness impaired, Online awareness impaired Memory impairment (select all impairments): Short-term memory, Working civil service fast streamer, Engineer, structural memory Attention impairment (select first level of impairment): Sustained attention Executive functioning impairment (select all impairments): Initiation, Organization, Sequencing, Reasoning, Problem solving .                 Following commands: Impaired Following commands impaired: Follows one step commands inconsistently      Cueing   Cueing Techniques: Verbal cues, Gestural cues  Exercises      Shoulder Instructions       General Comments      Pertinent Vitals/ Pain       Pain Assessment Pain Assessment: Faces Faces Pain Scale: Hurts little more Pain Location: generalized Pain Descriptors / Indicators: Discomfort, Grimacing, Guarding Pain Intervention(s): Limited activity within patient's tolerance  Home Living                                          Prior Functioning/Environment              Frequency  Min 2X/week        Progress Toward Goals  OT Goals(current goals can now be found in the care plan section)  Progress towards OT goals: Progressing toward goals  Acute Rehab OT Goals Patient Stated Goal: per girlfriend to get better OT Goal Formulation: With patient Time For Goal Achievement: 09/20/24 Potential to Achieve Goals: Good ADL Goals Pt Will Perform Grooming: with supervision;standing Pt Will Perform Upper Body Dressing: with supervision Pt Will Perform Lower Body Dressing: with supervision;sit to/from stand Pt Will Transfer to Toilet: with supervision;ambulating Additional ADL Goal #1: Pt will indep sequence through a basic ADL task  Plan      Co-evaluation    PT/OT/SLP Co-Evaluation/Treatment: Yes Reason for Co-Treatment: Complexity of the patient's impairments (multi-system involvement);For patient/therapist  safety;To address functional/ADL transfers PT goals addressed during session: Mobility/safety with mobility;Balance OT goals addressed during session: ADL's and self-care      AM-PAC OT 6 Clicks Daily Activity     Outcome Measure   Help from another person eating meals?: A Little Help from another person taking care of personal grooming?: A Little Help from another person toileting, which includes using toliet, bedpan, or urinal?: A Lot Help from another person bathing (including washing, rinsing, drying)?: A Lot Help from another person to put on and taking off regular upper body clothing?: A Lot Help from another person to put on and taking off regular lower body clothing?: A Lot 6 Click Score: 14    End of Session Equipment Utilized During Treatment: Gait belt  OT Visit Diagnosis: Unsteadiness on feet (R26.81);Other abnormalities of gait and mobility (R26.89);Muscle weakness (generalized) (M62.81);Pain Pain - part of body:  (generalized)   Activity Tolerance Patient tolerated treatment well   Patient Left with family/visitor present;in chair;with chair alarm set;with call bell/phone within reach   Nurse Communication Mobility status        Time: 8793-8768 OT Time Calculation (min): 25 min  Charges: OT General Charges $OT Visit: 1 Visit OT Treatments $Self Care/Home Management : 8-22 mins  Kreg Sink, OT/L   Acute OT Clinical Specialist Acute Rehabilitation Services Pager [phone] Office [phone]   [facility] 09/07/2024, 2:09 PM

## 2024-09-07 NOTE — Progress Notes (Signed)
 SLP Cancellation Note  Patient Details Name: Clinton Phillips MRN: [id number] DOB: 1961/05/07   Cancelled treatment:       Reason Eval/Treat Not Completed: Fatigue/lethargy limiting ability to participate. Attempted this am, working with PT/OT. Returned in pm and pt sleeping very deeply. Will continue efforts.    Zuleyka Kloc, Consuelo Fitch 09/07/2024, 3:51 PM

## 2024-09-07 NOTE — Progress Notes (Signed)
 Physical Therapy Treatment Patient Details Name: Clinton Phillips MRN: [id number] DOB: Feb 25, 1961 Today's Date: 09/07/2024   History of Present Illness 63 yo male presents to [facility] from [facility] on 11/24 with headache, AMS. CTH showed acute SAH with concern for Acomm rupture and small volume IVH with mild ventriculomegaly c/w hydrocephalus. EVD 11/24. ETT 11/24-11/25. PMHx: none.    PT Comments  Pt more pleasant and cooperative today vs eval, following one-step commands with cues and frequent redirecting needed. Pt tolerating transfer OOB to recliner with mod +2 assist, pt requiring step-by-step sequencing cues to reach chair. Pt with R lateral leaning in sitting and standing, difficult for pt to correct without assist. Pt left up in chair, posey alarm belt utilized for pt safety and fiance at bedside.    If plan is discharge home, recommend the following: A lot of help with walking and/or transfers;A lot of help with bathing/dressing/bathroom   Can travel by private vehicle        Equipment Recommendations  None recommended by PT    Recommendations for Other Services       Precautions / Restrictions Precautions Precautions: Fall Recall of Precautions/Restrictions: Impaired Precaution/Restrictions Comments: EVD (clamped during session and RN aware pt up in chair at end of session) Restrictions Weight Bearing Restrictions Per Provider Order: No     Mobility  Bed Mobility Overal bed mobility: Needs Assistance Bed Mobility: Supine to Sit     Supine to sit: Min assist, +2 for safety/equipment     General bed mobility comments: assist for completion of LE progression to EOB, trunk elevation, pt able to scoot self to EOB. increased time and sequencing cues    Transfers Overall transfer level: Needs assistance Equipment used: 2 person hand held assist Transfers: Sit to/from Stand, Bed to chair/wheelchair/BSC Sit to Stand: Min assist, +2 safety/equipment   Step pivot  transfers: Mod assist, +2 safety/equipment       General transfer comment: distracted; able to redirect; shuffling steps to chair and requires cues for each step as pt stopping/starting multiple times    Ambulation/Gait                   Stairs             Wheelchair Mobility     Tilt Bed    Modified Rankin (Stroke Patients Only)       Balance Overall balance assessment: Needs assistance Sitting-balance support: Feet supported Sitting balance-Leahy Scale: Good     Standing balance support: Bilateral upper extremity supported, During functional activity Standing balance-Leahy Scale: Poor                              Communication Communication Communication: No apparent difficulties  Cognition Arousal: Alert Behavior During Therapy: Restless, Impulsive   PT - Cognitive impairments: Difficult to assess, Awareness, Memory, Attention, Sequencing, Problem solving, Safety/Judgement                       PT - Cognition Comments: oriented to self, follows one-step commands consistently today. distractible, but easily redirectable to task at hand with cues. Following commands: Impaired Following commands impaired: Follows one step commands inconsistently    Cueing Cueing Techniques: Verbal cues, Gestural cues  Exercises      General Comments General comments (skin integrity, edema, etc.): EVD clamped by RN at start of session, pt up in chair at end of session and RN  aware. 2LO2 removed during session, but with sitting up in chair pt desat to 88% so 2LO2 replaced.      Pertinent Vitals/Pain Pain Assessment Pain Assessment: Faces Faces Pain Scale: Hurts little more Pain Location: generalized Pain Descriptors / Indicators: Discomfort, Grimacing, Guarding Pain Intervention(s): Monitored during session, Limited activity within patient's tolerance, Repositioned    Home Living                          Prior Function             PT Goals (current goals can now be found in the care plan section) Acute Rehab PT Goals Patient Stated Goal: home PT Goal Formulation: With patient Time For Goal Achievement: 09/20/24 Potential to Achieve Goals: Good Progress towards PT goals: Progressing toward goals    Frequency    Min 2X/week      PT Plan      Co-evaluation PT/OT/SLP Co-Evaluation/Treatment: Yes Reason for Co-Treatment: Complexity of the patient's impairments (multi-system involvement);For patient/therapist safety;To address functional/ADL transfers PT goals addressed during session: Mobility/safety with mobility;Balance OT goals addressed during session: ADL's and self-care      AM-PAC PT 6 Clicks Mobility   Outcome Measure  Help needed turning from your back to your side while in a flat bed without using bedrails?: A Little Help needed moving from lying on your back to sitting on the side of a flat bed without using bedrails?: A Little Help needed moving to and from a bed to a chair (including a wheelchair)?: A Lot Help needed standing up from a chair using your arms (e.g., wheelchair or bedside chair)?: A Lot Help needed to walk in hospital room?: A Lot Help needed climbing 3-5 steps with a railing? : Total 6 Click Score: 13    End of Session Equipment Utilized During Treatment: Gait belt Activity Tolerance: Patient limited by fatigue Patient left: in chair;with call bell/phone within reach;with chair alarm set;with family/visitor present (waist posey alarm donned) Nurse Communication: Mobility status PT Visit Diagnosis: Other abnormalities of gait and mobility (R26.89);Muscle weakness (generalized) (M62.81)     Time: 1207-1225 PT Time Calculation (min) (ACUTE ONLY): 18 min  Charges:    $Therapeutic Activity: 8-22 mins PT General Charges $$ ACUTE PT VISIT: 1 Visit                     Johana RAMAN, PT DPT Acute Rehabilitation Services Secure Chat Preferred  Office  [phone]    Jameisha Stofko [place] 09/07/2024, 6:09 PM

## 2024-09-07 NOTE — Op Note (Addendum)
 DATE OF SURGERY: 09/05/2024   ATTENDING SURGEON: Dino Sable, MD   ASSISTANT: None   PREOPERATIVE DIAGNOSIS: SAH   POSTOPERATIVE DIAGNOSIS: Same    PROCEDURE PERFORMED:  1. Insertion RIGHT frontal external ventricular drain - bedside procedure  ANESTHESIA: Local anesthesia     ESTIMATED BLOOD LOSS, URINE OUTPUT, AND CRYSTALLOIDS:   See chart.     COMPLICATIONS: None.     SPECIMENS: None   DRAINS: External Ventricular Drain    PREOPERATIVE COURSE:   63yo gentleman with a subarachnoid hemorrhage was admitted to our ICU. The CT scan demonstrated ventriculomegaly and prior to treatment, a drain was recommended. I spoke to his family and discussed the risks and benefits and they agreed and requested for us  to proceed.   DESCRIPTION OF PROCEDURE: After sterile prep and drape, 1% lidocaine  with epinephrine  was injected at Kocher's point. After the incision was made, a burrhole was made with a twistdrill and the dura was opened with a 11 blade. Then, at 5.5cm, access was gained to the right frontal horn. Care was taken not to let more than a few cc's of CSF be let off. The EVD was then tunneled and capped off. The incision was then closed in a single layer and the drain was connected to the reservoir and secured to the skin. The EVD was kept open at +20cm.  At the end of the procedure, all counts were complete

## 2024-09-07 NOTE — Progress Notes (Signed)
 NAME:  Clinton Phillips, MRN:  [id number], DOB:  02-22-1961, LOS: 2 ADMISSION DATE:  09/05/2024, CONSULTATION DATE:  09/05/24 REFERRING MD:  Suzanne - [facility] CHIEF COMPLAINT:  Headache   History of Present Illness:  Pt is encephelopathic; therefore, this HPI is obtained from chart review. Clinton Phillips is a 63 y.o. male who has no significant PMH. He presented to [facility] ED 11/24 with headache that started the night prior around midnight before he went to bed.  He didn't wake up for work morning of 11/24 so family checked on him and found him to be altered.  He was taken to [facility] where [facility] showed acute SAH with concern for Acomm rupture and small volume IVH with mild ventriculomegaly c/w hydrocephalus. 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  Case was discussed with neurosurgery who recommended emergent transfer to [facility] ICU for further evaluation and management. Prior to transfer to [facility], he was intubated for airway protection.  Pertinent  Medical History:  has SAH (subarachnoid hemorrhage) (HCC) on their problem list.  Significant Hospital Events: Including procedures, antibiotic start and stop dates in addition to other pertinent events   11/24 admit, underwent EVD placement and aneurysmal coiling 09/06/24 Extubated    Interim History / Subjective:  Patient has no complaints. Has a mild sore thorat after extubation  Objective:  Blood pressure 126/73, pulse 73, temperature 99.8 F (37.7 C), temperature source Axillary, resp. rate 18, SpO2 93%.    Vent Mode: PSV;CPAP FiO2 (%):  [40 %] 40 % Set Rate:  [18 bmp] 18 bmp Vt Set:  [640 mL] 640 mL PEEP:  [5 cmH20] 5 cmH20 Pressure Support:  [5 cmH20] 5 cmH20   Intake/Output Summary (Last 24 hours) at 09/07/2024 0739 Last data filed at 09/07/2024 0700 Gross per 24 hour  Intake 77.26 ml  Output 1758 ml  Net -1680.74 ml    There were no vitals filed for this visit.   Physical Exam: General: Adult male, awake, no  distress Neuro: Alert and oriented x 3, motor and sensation grossly intact HEENT: [place]/AT. Sclerae anicteric.  EVD in place Cardiovascular: Regular rate and rhythm Lungs: Clear to auscultation bilaterally Abdomen: Soft, nontender, nondistended Musculoskeletal: No edema   Labs/imaging personally reviewed:  CT head 11/24 > acute SAH with concern for Acom rupture and small volume IVH with mild ventriculomegaly c/w hydrocephalus. CTA head 11/24 >  small Acomm aneurysm.  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 #EVD in place - Daily TCD's - EVD management per neurosurgery  - Frequent neurochecks spaced to Q4 - Continue Keppra  for 7 days - Post coiling can liberate blood pressure parameters to 160 systolic - Continue nimodipine   Pulm # Acute hypoxic respiratory failure in the setting of sedation Extubated to room air  Cardiac/Vascular  No issues  GI Diet: Regular diet GI PPX: DC Protonix   ID No issues  Renal  Hyperkalemia improved Creatinine 1.25 stable  Endo Q4h fingersticks   Heme/Onc DVT ppx : SCD   MSK/other   No issues    Labs   CBC: Recent Labs  Lab 09/05/24 1101 09/05/24 1739 09/05/24 1834 09/06/24 0456 09/07/24 0216  WBC 7.1  --   --  7.3 8.7  HGB 12.0* 10.5* 11.6* 11.6* 11.2*  HCT 35.5* 31.0* 34.0* 35.9* 33.0*  MCV 95.9  --   --  99.4 97.1  PLT 222  --   --  251 205  Basic Metabolic Panel: Recent Labs  Lab 09/05/24 1101 09/05/24 1739 09/05/24 1834 09/05/24 1930 09/06/24 0456 09/06/24 1938 09/07/24 0216  NA 138 139 140 141 137 138  --   K 4.1 3.7 4.3 3.8 5.5* 3.8  --   CL 106  --   --  107 108 106  --   CO2 23  --   --  22 20* 22  --   GLUCOSE 141*  --   --  135* 156* 129*  --   BUN 11  --   --  9 17 23   --   CREATININE 1.10  --   --  1.15 1.25* 1.25*  --   CALCIUM 8.8*  --   --  8.6* 8.3* 8.4*  --   MG  --   --   --   --  2.0  --  1.9  PHOS  --   --    --   --  3.6  --  3.3   GFR: Estimated Creatinine Clearance: 75.7 mL/min (A) (by C-G formula based on SCr of 1.25 mg/dL (H)). Recent Labs  Lab 09/05/24 1101 09/06/24 0456 09/07/24 0216  WBC 7.1 7.3 8.7  LATICACIDVEN 2.2*  --   --     Liver Function Tests: No results for input(s): AST, ALT, ALKPHOS, BILITOT, PROT, ALBUMIN in the last 168 hours. No results for input(s): LIPASE, AMYLASE in the last 168 hours. No results for input(s): AMMONIA in the last 168 hours.  ABG    Component Value Date/Time   PHART 7.396 09/05/2024 1834   PCO2ART 40.9 09/05/2024 1834   PO2ART 288 (H) 09/05/2024 1834   HCO3 25.0 09/05/2024 1834   TCO2 26 09/05/2024 1834   ACIDBASEDEF 3.0 (H) 09/05/2024 1739   O2SAT 100 09/05/2024 1834     Coagulation Profile: Recent Labs  Lab 09/05/24 1101  INR 1.0    Cardiac Enzymes: No results for input(s): CKTOTAL, CKMB, CKMBINDEX, TROPONINI in the last 168 hours.  HbA1C: No results found for: HGBA1C  CBG: Recent Labs  Lab 09/06/24 2019 09/06/24 2355 09/07/24 0311  GLUCAP 116* 114* 93    Review of Systems:   Unable to obtain as pt is encephalopathic.  Past Medical History:  He,  has a past medical history of Anemia.   Surgical History:   Past Surgical History:  Procedure Laterality Date   IR 3D INDEPENDENT WKST  09/05/2024   IR ANGIO INTRA EXTRACRAN SEL COM CAROTID INNOMINATE UNI L MOD SED  09/05/2024   IR ANGIOGRAM FOLLOW UP STUDY  09/05/2024   IR ANGIOGRAM FOLLOW UP STUDY  09/05/2024   IR ANGIOGRAM FOLLOW UP STUDY  09/05/2024   IR CT HEAD LTD  09/05/2024   IR TRANSCATH/EMBOLIZ  09/05/2024   IR US  GUIDE VASC ACCESS RIGHT  09/05/2024   RADIOLOGY WITH ANESTHESIA N/A 09/05/2024   Procedure: RADIOLOGY WITH ANESTHESIA;  Surgeon: Radiologist, Medication, MD;  Location: [facility] OR;  Service: Radiology;  Laterality: N/A;     Social History:   reports that he has been smoking cigarettes. He has a 23 pack-year smoking  history. He has never used smokeless tobacco. He reports current alcohol use of about 12.0 standard drinks of alcohol per week. He reports current drug use. Drug: Marijuana.   Family History:  His family history is not on file.   Allergies No Known Allergies   Home Medications  Prior to Admission medications   Medication Sig Start Date End Date Taking? Authorizing Provider  cyclobenzaprine  (FLEXERIL ) 5 MG tablet Take 1-2 tablets (5-10 mg total) by mouth 3 (three) times daily as needed for muscle spasms. Patient not taking: Reported on 09/05/2024 03/18/23   Charlene Debby BROCKS, PA-C  cyclobenzaprine  (FLEXERIL ) 5 MG tablet Take 1-2 tablets (5-10 mg total) by mouth 3 (three) times daily as needed for muscle spasms. Patient not taking: Reported on 09/05/2024 03/18/23   Gaines, Thomas C, PA-C  HYDROcodone -acetaminophen  (NORCO) 5-325 MG tablet Take 1 tablet by mouth every 6 (six) hours as needed for moderate pain. Patient not taking: Reported on 09/05/2024 03/18/23   Gaines, Thomas C, PA-C  ondansetron  (ZOFRAN  ODT) 4 MG disintegrating tablet Take 1 tablet (4 mg total) by mouth every 8 (eight) hours as needed for nausea or vomiting. Patient not taking: Reported on 09/05/2024 01/05/21   Angelena Smalls, MD  predniSONE  (DELTASONE ) 10 MG tablet Take 1 tablet (10 mg total) by mouth daily. 6,5,4,3,2,1 six day taper Patient not taking: Reported on 09/05/2024 03/18/23   Charlene Debby BROCKS, PA-C    The patient is critically ill due to subarachnoid hemorrhage and acute hypoxic respiratory failure.  Critical care was necessary to treat or prevent imminent or life-threatening deterioration. Critical care time was spent by me on the following activities: development of a treatment plan with the patient and/or surrogate as well as nursing, discussions with consultants, evaluation of the patient's response to treatment, examination of the patient, obtaining a history from the patient or surrogate, ordering and performing  treatments and interventions, ordering and review of laboratory studies, ordering and review of radiographic studies, review of telemetry data including pulse oximetry, re-evaluation of patient's condition and participation in multidisciplinary rounds.   I personally spent 31 minutes providing critical care not including any separately billable procedures.   Zola LOISE Herter, MD [place] [facility] 09/07/2024 7:39 AM

## 2024-09-08 ENCOUNTER — Inpatient Hospital Stay (HOSPITAL_COMMUNITY)

## 2024-09-08 LAB — BASIC METABOLIC PANEL WITH GFR
Anion gap: 11 (ref 5–15)
BUN: 18 mg/dL (ref 8–23)
CO2: 23 mmol/L (ref 22–32)
Calcium: 8.7 mg/dL — ABNORMAL LOW (ref 8.9–10.3)
Chloride: 102 mmol/L (ref 98–111)
Creatinine, Ser: 1.16 mg/dL (ref 0.61–1.24)
GFR, Estimated: >60 mL/min (ref >60)
Glucose, Bld: 101 mg/dL — ABNORMAL HIGH (ref 70–99)
Potassium: 3.9 mmol/L (ref 3.5–5.1)
Sodium: 136 mmol/L (ref 135–145)

## 2024-09-08 LAB — CBC
HCT: 35.4 % — ABNORMAL LOW (ref 39.0–52.0)
Hemoglobin: 11.9 g/dL — ABNORMAL LOW (ref 13.0–17.0)
MCH: 32 pg (ref 26.0–34.0)
MCHC: 33.6 g/dL (ref 30.0–36.0)
MCV: 95.2 fL (ref 80.0–100.0)
Platelets: 204 K/uL (ref 150–400)
RBC: 3.72 MIL/uL — ABNORMAL LOW (ref 4.22–5.81)
RDW: 12.1 % (ref 11.5–15.5)
WBC: 5.9 K/uL (ref 4.0–10.5)
nRBC: 0 % (ref 0.0–0.2)

## 2024-09-08 LAB — GLUCOSE, CAPILLARY
Glucose-Capillary: 101 mg/dL — ABNORMAL HIGH (ref 70–99)
Glucose-Capillary: 129 mg/dL — ABNORMAL HIGH (ref 70–99)

## 2024-09-08 LAB — PHOSPHORUS: Phosphorus: 3.9 mg/dL (ref 2.5–4.6)

## 2024-09-08 LAB — MAGNESIUM: Magnesium: 2 mg/dL (ref 1.7–2.4)

## 2024-09-08 MED ORDER — DOCUSATE SODIUM 100 MG PO CAPS
100.0000 mg | ORAL_CAPSULE | Freq: Two times a day (BID) | ORAL | Status: DC
  Administered 2024-09-08 – 2024-09-19 (×16): 100 mg via ORAL
  Filled 2024-09-08 (×16): qty 1

## 2024-09-08 NOTE — Progress Notes (Signed)
 NAME:  Clinton Phillips, MRN:  [id number], DOB:  05/21/1961, LOS: 3 ADMISSION DATE:  09/05/2024, CONSULTATION DATE:  09/05/24 REFERRING MD:  Suzanne - [facility] CHIEF COMPLAINT:  Headache   History of Present Illness:  Pt is encephelopathic; therefore, this HPI is obtained from chart review. Clinton Phillips is a 63 y.o. male who has no significant PMH. He presented to [facility] ED 11/24 with headache that started the night prior around midnight before he went to bed.  He didn't wake up for work morning of 11/24 so family checked on him and found him to be altered.  He was taken to [facility] where [facility] showed acute SAH with concern for Acomm rupture and small volume IVH with mild ventriculomegaly c/w hydrocephalus. 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  Case was discussed with neurosurgery who recommended emergent transfer to [facility] ICU for further evaluation and management. Prior to transfer to [facility], he was intubated for airway protection.  Pertinent  Medical History:  has SAH (subarachnoid hemorrhage) (HCC) on their problem list.  Significant Hospital Events: Including procedures, antibiotic start and stop dates in addition to other pertinent events   11/24 admit, underwent EVD placement and aneurysmal coiling 09/06/24 Extubated  09/08/24 low-grade fever 100.2   Interim History / Subjective:   Patient doing well, has no complaints  Objective:  Blood pressure 138/79, pulse 70, temperature 99.6 F (37.6 C), temperature source Axillary, resp. rate (!) 21, SpO2 93%.        Intake/Output Summary (Last 24 hours) at 09/08/2024 9171 Last data filed at 09/08/2024 0600 Gross per 24 hour  Intake 120 ml  Output 2327 ml  Net -2207 ml    There were no vitals filed for this visit.   Physical Exam: General: Adult male awake and not in distress Neuro: Alert oriented x 3, motor and sensation intact HEENT: Normocephalic, atraumatic, EVD in place Cardiovascular: Regular rate and  rhythm Lungs: Clear to auscultation Abdomen: Soft, nontender, nondistended Musculoskeletal: No edema   Labs/imaging personally reviewed:  CT head 11/24 > acute SAH with concern for Acom rupture and small volume IVH with mild ventriculomegaly c/w hydrocephalus. CTA head 11/24 >  small Acomm aneurysm.  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 No Foley.,  No central IV access and only has peripheral IVs.  Plan to check chest x-ray.  Renal  Hyperkalemia improved   Endo DC Q4 fingersticks as his blood sugars have been stable  Heme/Onc DVT ppx : SCD, heparin  added   MSK/other   No issues    Labs   CBC: Recent Labs  Lab 09/05/24 1101 09/05/24 1739 09/05/24 1834 09/06/24 0456 09/07/24 0216 09/08/24 0214  WBC 7.1  --   --  7.3 8.7 5.9  HGB 12.0* 10.5* 11.6* 11.6* 11.2* 11.9*  HCT 35.5* 31.0* 34.0* 35.9* 33.0* 35.4*  MCV 95.9  --   --  99.4 97.1 95.2  PLT 222  --   --  251 205 204  Basic Metabolic Panel: Recent Labs  Lab 09/05/24 1930 09/06/24 0456 09/06/24 1938 09/07/24 0216 09/07/24 0739 09/08/24 0214  NA 141 137 138  --  135 136  K 3.8 5.5* 3.8  --  3.9 3.9  CL 107 108 106  --  103 102  CO2 22 20* 22  --  24 23  GLUCOSE 135* 156* 129*  --  95 101*  BUN 9 17 23   --  18 18  CREATININE 1.15 1.25* 1.25*  --  1.12 1.16  CALCIUM 8.6* 8.3* 8.4*  --  8.3* 8.7*  MG  --  2.0  --  1.9  --  2.0  PHOS  --  3.6  --  3.3  --   3.9   GFR: Estimated Creatinine Clearance: 81.6 mL/min (by C-G formula based on SCr of 1.16 mg/dL). Recent Labs  Lab 09/05/24 1101 09/06/24 0456 09/07/24 0216 09/08/24 0214  WBC 7.1 7.3 8.7 5.9  LATICACIDVEN 2.2*  --   --   --     Liver Function Tests: No results for input(s): AST, ALT, ALKPHOS, BILITOT, PROT, ALBUMIN in the last 168 hours. No results for input(s): LIPASE, AMYLASE in the last 168 hours. No results for input(s): AMMONIA in the last 168 hours.  ABG    Component Value Date/Time   PHART 7.396 09/05/2024 1834   PCO2ART 40.9 09/05/2024 1834   PO2ART 288 (H) 09/05/2024 1834   HCO3 25.0 09/05/2024 1834   TCO2 26 09/05/2024 1834   ACIDBASEDEF 3.0 (H) 09/05/2024 1739   O2SAT 100 09/05/2024 1834     Coagulation Profile: Recent Labs  Lab 09/05/24 1101  INR 1.0    Cardiac Enzymes: No results for input(s): CKTOTAL, CKMB, CKMBINDEX, TROPONINI in the last 168 hours.  HbA1C: No results found for: HGBA1C  CBG: Recent Labs  Lab 09/07/24 1509 09/07/24 1912 09/07/24 2318 09/08/24 0310 09/08/24 0748  GLUCAP 124* 157* 94 101* 129*    Review of Systems:   Unable to obtain as pt is encephalopathic.  Past Medical History:  He,  has a past medical history of Anemia.   Surgical History:   Past Surgical History:  Procedure Laterality Date   IR 3D INDEPENDENT WKST  09/05/2024   IR ANGIO INTRA EXTRACRAN SEL COM CAROTID INNOMINATE UNI L MOD SED  09/05/2024   IR ANGIOGRAM FOLLOW UP STUDY  09/05/2024   IR ANGIOGRAM FOLLOW UP STUDY  09/05/2024   IR ANGIOGRAM FOLLOW UP STUDY  09/05/2024   IR CT HEAD LTD  09/05/2024   IR TRANSCATH/EMBOLIZ  09/05/2024   IR US  GUIDE VASC ACCESS RIGHT  09/05/2024   RADIOLOGY WITH ANESTHESIA N/A 09/05/2024   Procedure: RADIOLOGY WITH ANESTHESIA;  Surgeon: Radiologist, Medication, MD;  Location: [facility] OR;  Service: Radiology;  Laterality: N/A;     Social History:   reports that he has been smoking  cigarettes. He has a 23 pack-year smoking history. He has never used smokeless tobacco. He reports current alcohol use of about 12.0 standard drinks of alcohol per week. He reports current drug use. Drug: Marijuana.   Family History:  His family history is not on file.   Allergies No Known Allergies   Home Medications  Prior to Admission medications   Medication Sig Start Date End Date Taking? Authorizing Provider  cyclobenzaprine  (FLEXERIL ) 5 MG tablet Take 1-2 tablets (5-10 mg total) by mouth 3 (three) times daily as needed for muscle spasms. Patient not taking: Reported on 09/05/2024 03/18/23   Charlene Debby BROCKS,  PA-C  cyclobenzaprine  (FLEXERIL ) 5 MG tablet Take 1-2 tablets (5-10 mg total) by mouth 3 (three) times daily as needed for muscle spasms. Patient not taking: Reported on 09/05/2024 03/18/23   Gaines, Thomas C, PA-C  HYDROcodone -acetaminophen  (NORCO) 5-325 MG tablet Take 1 tablet by mouth every 6 (six) hours as needed for moderate pain. Patient not taking: Reported on 09/05/2024 03/18/23   Charlene Debby BROCKS, PA-C  ondansetron  (ZOFRAN  ODT) 4 MG disintegrating tablet Take 1 tablet (4 mg total) by mouth every 8 (eight) hours as needed for nausea or vomiting. Patient not taking: Reported on 09/05/2024 01/05/21   Angelena Smalls, MD  predniSONE  (DELTASONE ) 10 MG tablet Take 1 tablet (10 mg total) by mouth daily. 6,5,4,3,2,1 six day taper Patient not taking: Reported on 09/05/2024 03/18/23   Charlene Debby BROCKS, PA-C    The patient is critically ill due to subarachnoid hemorrhage requiring EVD placement and monitoring.  Critical care was necessary to treat or prevent imminent or life-threatening deterioration. Critical care time was spent by me on the following activities: development of a treatment plan with the patient and/or surrogate as well as nursing, discussions with consultants, evaluation of the patient's response to treatment, examination of the patient, obtaining a history from the patient or  surrogate, ordering and performing treatments and interventions, ordering and review of laboratory studies, ordering and review of radiographic studies, review of telemetry data including pulse oximetry, re-evaluation of patient's condition and participation in multidisciplinary rounds.   I personally spent 30 minutes providing critical care not including any separately billable procedures.   Zola LOISE Herter, MD [place] [facility] 09/08/2024 8:32 AM

## 2024-09-09 ENCOUNTER — Inpatient Hospital Stay (HOSPITAL_COMMUNITY)

## 2024-09-09 DIAGNOSIS — I609 Nontraumatic subarachnoid hemorrhage, unspecified: Secondary | ICD-10-CM | POA: Diagnosis not present

## 2024-09-09 LAB — CBC
HCT: 37.2 % — ABNORMAL LOW (ref 39.0–52.0)
Hemoglobin: 12.8 g/dL — ABNORMAL LOW (ref 13.0–17.0)
MCH: 32.5 pg (ref 26.0–34.0)
MCHC: 34.4 g/dL (ref 30.0–36.0)
MCV: 94.4 fL (ref 80.0–100.0)
Platelets: 197 K/uL (ref 150–400)
RBC: 3.94 MIL/uL — ABNORMAL LOW (ref 4.22–5.81)
RDW: 11.9 % (ref 11.5–15.5)
WBC: 5.9 K/uL (ref 4.0–10.5)
nRBC: 0 % (ref 0.0–0.2)

## 2024-09-09 LAB — PHOSPHORUS: Phosphorus: 4 mg/dL (ref 2.5–4.6)

## 2024-09-09 LAB — BASIC METABOLIC PANEL WITH GFR
Anion gap: 11 (ref 5–15)
BUN: 16 mg/dL (ref 8–23)
CO2: 24 mmol/L (ref 22–32)
Calcium: 8.7 mg/dL — ABNORMAL LOW (ref 8.9–10.3)
Chloride: 101 mmol/L (ref 98–111)
Creatinine, Ser: 1.09 mg/dL (ref 0.61–1.24)
GFR, Estimated: >60 mL/min (ref >60)
Glucose, Bld: 135 mg/dL — ABNORMAL HIGH (ref 70–99)
Potassium: 3.9 mmol/L (ref 3.5–5.1)
Sodium: 136 mmol/L (ref 135–145)

## 2024-09-09 LAB — MAGNESIUM: Magnesium: 2.1 mg/dL (ref 1.7–2.4)

## 2024-09-09 MED ORDER — LACTATED RINGERS IV BOLUS
500.0000 mL | Freq: Once | INTRAVENOUS | Status: AC
  Administered 2024-09-09: 500 mL via INTRAVENOUS

## 2024-09-09 NOTE — Evaluation (Signed)
 Speech Language Pathology Evaluation Patient Details Name: Clinton Phillips MRN: [id number] DOB: Mar 13, 1961 Today's Date: 09/09/2024 Time: 1001-1019 SLP Time Calculation (min) (ACUTE ONLY): 18 min  Problem List:  Patient Active Problem List   Diagnosis Date Noted   SAH (subarachnoid hemorrhage) (HCC) 09/05/2024   Past Medical History:  Past Medical History:  Diagnosis Date   Anemia    Past Surgical History:  Past Surgical History:  Procedure Laterality Date   IR 3D INDEPENDENT WKST  09/05/2024   IR ANGIO INTRA EXTRACRAN SEL COM CAROTID INNOMINATE UNI L MOD SED  09/05/2024   IR ANGIOGRAM FOLLOW UP STUDY  09/05/2024   IR ANGIOGRAM FOLLOW UP STUDY  09/05/2024   IR ANGIOGRAM FOLLOW UP STUDY  09/05/2024   IR CT HEAD LTD  09/05/2024   IR TRANSCATH/EMBOLIZ  09/05/2024   IR US  GUIDE VASC ACCESS RIGHT  09/05/2024   RADIOLOGY WITH ANESTHESIA N/A 09/05/2024   Procedure: RADIOLOGY WITH ANESTHESIA;  Surgeon: Radiologist, Medication, MD;  Location: [facility] OR;  Service: Radiology;  Laterality: N/A;   HPI:  Clinton Phillips is a 63 y.o. male admitted and intubated on 11/24 due to acute SAH and small volume IVH with mild ventriculomegaly with hydrocephalus and confirmed anterior communicating aneurysm. Pt underwent EVD placement and aneurysmal coiling  11/25. ETT 11/24-11/25.   Assessment / Plan / Recommendation Clinical Impression  Pt is oriented to self, place, and siutation but not to time when provided environmental cueing. He often deflects with humor but exhibits cognitive impairments related to sustained attention, delayed recall, problem solving, sequencing, and reasoning. He required Max cueing for step by step sequencing with verbal reasoning and problem solving. Given multiple choices, he could not recall four novel words after a delay. Suspect difficulty sustaining attention to tasks also impacts his performance. Pt's fiance is able to provide 24/7 post-acute supervision. Recommend intensive  SLP f/u, >3 hrs/day to target deficits listed above.    SLP Assessment  SLP Recommendation/Assessment: Patient needs continued Speech Language Pathology Services SLP Visit Diagnosis: Cognitive communication deficit (R41.841);Attention and concentration deficit Attention and concentration deficit following: Nontraumatic SAH;Nontraumatic intracerebral hemorrhage     Assistance Recommended at Discharge  Frequent or constant Supervision/Assistance  Functional Status Assessment Patient has had a recent decline in their functional status and demonstrates the ability to make significant improvements in function in a reasonable and predictable amount of time.  Frequency and Duration min 2x/week  2 weeks      SLP Evaluation Cognition  Overall Cognitive Status: Impaired/Different from baseline Arousal/Alertness: Awake/alert Orientation Level: Oriented to person;Oriented to place;Disoriented to time;Oriented to situation Year: 2026 Day of Week: Incorrect Attention: Sustained Sustained Attention: Impaired Sustained Attention Impairment: Verbal complex Memory: Impaired Memory Impairment: Retrieval deficit Awareness: Impaired Awareness Impairment: Intellectual impairment Problem Solving: Impaired Problem Solving Impairment: Verbal basic Executive Function: Reasoning;Sequencing Reasoning: Impaired Reasoning Impairment: Verbal basic Sequencing: Impaired Sequencing Impairment: Verbal basic       Comprehension  Auditory Comprehension Overall Auditory Comprehension: Appears within functional limits for tasks assessed    Expression Expression Primary Mode of Expression: Verbal Verbal Expression Overall Verbal Expression: Appears within functional limits for tasks assessed   Oral / Motor  Oral Motor/Sensory Function Overall Oral Motor/Sensory Function: Within functional limits Motor Speech Overall Motor Speech: Appears within functional limits for tasks assessed            Damien Blumenthal, M.A., CCC-SLP Speech Language Pathology, Acute Rehabilitation Services  Secure Chat preferred [phone]  09/09/2024, 11:05 AM

## 2024-09-09 NOTE — Progress Notes (Signed)
 Occupational Therapy Treatment Patient Details Name: Clinton Phillips MRN: [id number] DOB: 01-20-1961 Today's Date: 09/09/2024   History of present illness 63 yo male presents to [facility] from [facility] on 11/24 with headache, AMS. CTH showed acute SAH with concern for Acomm rupture and small volume IVH with mild ventriculomegaly c/w hydrocephalus. EVD 11/24. ETT 11/24-11/25. PMHx: none.   OT comments  EVD clamed by the RN at the start of the session. Pt is making steady progress towards their acute OT goals. Overall his cognition was improved but still impaired, he was more participatory this date and reasoned well with education. He required min A +2 for bed mobility and mod A +2 for tranfers and ambulating in the room with HHA. Consistent posterior bias, cues and assist to correct. OT to continue to follow acutely to facilitate progress towards established goals. Pt will continue to benefit from intensive inpatient follow up therapy, >3 hours/day after discharge.        If plan is discharge home, recommend the following:  A lot of help with walking and/or transfers;A lot of help with bathing/dressing/bathroom;Assistance with cooking/housework;Assist for transportation   Equipment Recommendations  Other (comment)    Recommendations for Other Services Rehab consult    Precautions / Restrictions Precautions Precautions: Fall Recall of Precautions/Restrictions: Impaired Precaution/Restrictions Comments: EVD (clamped during session and RN aware pt up in chair at end of session) Restrictions Weight Bearing Restrictions Per Provider Order: No       Mobility Bed Mobility Overal bed mobility: Needs Assistance Bed Mobility: Supine to Sit     Supine to sit: Min assist, +2 for physical assistance, +2 for safety/equipment          Transfers Overall transfer level: Needs assistance Equipment used: 2 person hand held assist Transfers: Sit to/from Stand Sit to Stand: Mod assist,  +2 physical assistance, +2 safety/equipment           General transfer comment: posterior LOB, cues for posture     Balance Overall balance assessment: Needs assistance Sitting-balance support: Feet supported Sitting balance-Leahy Scale: Good     Standing balance support: Bilateral upper extremity supported, During functional activity Standing balance-Leahy Scale: Poor                             ADL either performed or assessed with clinical judgement   ADL Overall ADL's : Needs assistance/impaired                         Toilet Transfer: Moderate assistance;+2 for physical assistance;+2 for safety/equipment Toilet Transfer Details (indicate cue type and reason): ambulated in the room, 1 sittign rest break         Functional mobility during ADLs: Moderate assistance;+2 for physical assistance;+2 for safety/equipment General ADL Comments: significant cues needed for cognition    Extremity/Trunk Assessment Upper Extremity Assessment Upper Extremity Assessment: Overall WFL for tasks assessed   Lower Extremity Assessment Lower Extremity Assessment: Defer to PT evaluation        Vision   Vision Assessment?: Wears glasses for reading   Perception Perception Perception: Within Functional Limits   Praxis Praxis Praxis: [facility]   Communication Communication Communication: No apparent difficulties   Cognition Arousal: Alert Behavior During Therapy: Restless, Impulsive Cognition: Cognition impaired   Orientation impairments: Time, Situation Awareness: Intellectual awareness impaired, Online awareness impaired Memory impairment (select all impairments): Short-term memory, Working civil service fast streamer, Conservation officer, historic buildings Attention impairment (select  first level of impairment): Sustained attention Executive functioning impairment (select all impairments): Initiation, Organization, Sequencing, Reasoning, Problem solving OT - Cognition Comments: cognition  improved but still impaired. Pt was more agreeable this date, able to rationalize and educate on the importance of therapy. overall he followed most simple 1 step commands                 Following commands: Impaired Following commands impaired: Follows one step commands inconsistently      Cueing   Cueing Techniques: Verbal cues, Gestural cues  Exercises      Shoulder Instructions       General Comments SpO2 down to 87 on RA, placed back on 2L    Pertinent Vitals/ Pain       Pain Assessment Pain Assessment: No/denies pain Faces Pain Scale: Hurts little more Pain Location: head Pain Descriptors / Indicators: Discomfort, Grimacing, Guarding Pain Intervention(s): Limited activity within patient's tolerance, Monitored during session  Home Living                         Bathroom Toilet: Handicapped height (also has standard) Bathroom Accessibility: Yes How Accessible: Accessible via walker            Prior Functioning/Environment              Frequency  Min 2X/week        Progress Toward Goals  OT Goals(current goals can now be found in the care plan section)  Progress towards OT goals: Progressing toward goals  Acute Rehab OT Goals Patient Stated Goal: to get better OT Goal Formulation: With patient Time For Goal Achievement: 09/20/24 Potential to Achieve Goals: Good ADL Goals Pt Will Perform Grooming: with supervision;standing Pt Will Perform Upper Body Dressing: with supervision Pt Will Perform Lower Body Dressing: with supervision;sit to/from stand Pt Will Transfer to Toilet: with supervision;ambulating Additional ADL Goal #1: Pt will indep sequence through a basic ADL task  Plan      Co-evaluation    PT/OT/SLP Co-Evaluation/Treatment: Yes Reason for Co-Treatment: Complexity of the patient's impairments (multi-system involvement);For patient/therapist safety;To address functional/ADL transfers   OT goals addressed during  session: ADL's and self-care      AM-PAC OT 6 Clicks Daily Activity     Outcome Measure   Help from another person eating meals?: A Little Help from another person taking care of personal grooming?: A Little Help from another person toileting, which includes using toliet, bedpan, or urinal?: A Lot Help from another person bathing (including washing, rinsing, drying)?: A Lot Help from another person to put on and taking off regular upper body clothing?: A Lot Help from another person to put on and taking off regular lower body clothing?: A Lot 6 Click Score: 14    End of Session Equipment Utilized During Treatment: Gait belt;Oxygen  OT Visit Diagnosis: Unsteadiness on feet (R26.81);Other abnormalities of gait and mobility (R26.89);Muscle weakness (generalized) (M62.81);Pain   Activity Tolerance Patient tolerated treatment well   Patient Left with family/visitor present;in chair;with chair alarm set;with call bell/phone within reach   Nurse Communication Mobility status        Time: 1444-1510 OT Time Calculation (min): 26 min  Charges: OT General Charges $OT Visit: 1 Visit OT Treatments $Therapeutic Activity: 8-22 mins  Lucie Kendall, OTR/L Acute Rehabilitation Services Office [phone] Secure Chat Communication Preferred   [name] 09/09/2024, 3:53 PM

## 2024-09-09 NOTE — PMR Pre-admission (Shared)
 PMR Admission Coordinator Pre-Admission Assessment  Patient: Clinton Phillips is an 63 y.o., male MRN: [id number] DOB: Nov 24, 1960 Height:   Weight:    Insurance Information HMO: ***    PPO: ***     PCP:      IPA:      80/20:      OTHER:  PRIMARY:  [place] Medicaid Healthy Blue     Policy#: [id number]      Subscriber: patient CM Name: ***      Phone#: ***     Fax#: *** Pre-Cert#: ***      Employer: *** Benefits:  Phone #: ***     Name: *** Eustacio. Date: ***     Deduct: ***      Out of Pocket Max: ***      Life Max: *** CIR: ***      SNF: *** Outpatient: ***     Co-Pay: *** Home Health: ***      Co-Pay: *** DME: ***     Co-Pay: *** Providers: in-network SECONDARY:       Policy#:      Phone#:   Financial Counselor:       Phone#:   The "Data Collection Information Summary" for patients in Inpatient Rehabilitation Facilities with attached "Privacy Act Statement-Health Care Records" was provided and verbally reviewed with: {CHL IP Patient Family WJ:[id number]}  Emergency Contact Information Contact Information     Name Relation Home Work Mobile   [place] Friend [phone]     [name]   [phone]      Other Contacts     Name Relation Home Work Mobile   worley,martica Daughter   [phone]   [facility] Daughter   [phone]   ashby, moskal   [phone]   [name]   [phone]       Current Medical History  Patient Admitting Diagnosis: SAH History of Present Illness: Pt is a 63 year old male with no significant medical hx. Pt presented to [facility] on 09/05/24 d/t headache and confusion. CT showed subarachnoid hemorrhage with concern for anterior communicating rupture and small volume IVH with mild ventriculomegaly c/w hydrocephalus. CTA confirmed anterior communicating artery aneurysm. Pt intubated. Pt transferred to [facility] for further treatment. Neurosurgery consulted. Pt underwent EVD  placement and aneurysmal coiling on 11/24 by Dr. Lester. Pt extubated on 11/25. Therapy evaluations completed and CIR recommended d/t pt's deficits in functional mobility. Complete NIHSS TOTAL: 1  Patient's medical record from [facility] has been reviewed by the rehabilitation admission coordinator and physician.  Past Medical History  Past Medical History:  Diagnosis Date   Anemia     Has the patient had major surgery during 100 days prior to admission? Yes  Family History   family history is not on file.  Current Medications  Current Facility-Administered Medications:    acetaminophen  (TYLENOL ) tablet 650 mg, 650 mg, Oral, Q4H PRN **OR** acetaminophen  (TYLENOL ) 160 MG/5ML solution 650 mg, 650 mg, Per Tube, Q4H PRN **OR** acetaminophen  (TYLENOL ) suppository 650 mg, 650 mg, Rectal, Q4H PRN, Desai, Rahul P, PA-C   Chlorhexidine  Gluconate Cloth 2 % PADS 6 each, 6 each, Topical, Daily, Desai, Rahul P, PA-C, 6 each at 09/09/24 1051   docusate sodium  (COLACE) capsule 100 mg, 100 mg, Oral, BID, Hattar, Laith N, MD, 100 mg at 09/09/24 9041   heparin  injection 5,000 Units, 5,000 Units, Subcutaneous, Q8H, Lester Golas, MD, 5,000 Units at 09/09/24 1315   labetalol  (NORMODYNE ) injection 0-20 mg, 0-20  mg, Intravenous, Q10 min PRN, Desai, Rahul P, PA-C   levETIRAcetam  (KEPPRA ) tablet 500 mg, 500 mg, Oral, BID, Hattar, [name], MD, 500 mg at 09/09/24 9041   lidocaine -EPINEPHrine  (XYLOCAINE  W/EPI) 1 %-1:100000 (with pres) injection 20 mL, 20 mL, Infiltration, Once, Hattar, [name], MD   niMODipine  (NIMOTOP ) capsule 60 mg, 60 mg, Oral, Q4H, 60 mg at 09/09/24 1155 **OR** niMODipine  (NYMALIZE ) 6 MG/ML oral solution 60 mg, 60 mg, Per Tube, Q4H, Desai, Rahul P, PA-C, 60 mg at 09/06/24 2011   ondansetron  (ZOFRAN -ODT) disintegrating tablet 4 mg, 4 mg, Oral, Q6H PRN **OR** ondansetron  (ZOFRAN ) injection 4 mg, 4 mg, Intravenous, Q6H PRN, Desai, Rahul P, PA-C   Oral care mouth rinse, 15 mL, Mouth Rinse,  PRN, Hattar, Laith N, MD   polyethylene glycol (MIRALAX  / GLYCOLAX ) packet 17 g, 17 g, Oral, Daily, Hattar, [name], MD, 17 g at 09/08/24 0934  Patients Current Diet:  Diet Order             Diet regular Room service appropriate? Yes with Assist; Fluid consistency: Thin  Diet effective now                   Precautions / Restrictions Precautions Precautions: Fall Precaution/Restrictions Comments: EVD (clamped during session and RN aware pt up in chair at end of session) Restrictions Weight Bearing Restrictions Per Provider Order: No   Has the patient had 2 or more falls or a fall with injury in the past year? No  Prior Activity Level    Prior Functional Level Self Care: Did the patient need help bathing, dressing, using the toilet or eating? Independent  Indoor Mobility: Did the patient need assistance with walking from room to room (with or without device)? Independent  Stairs: Did the patient need assistance with internal or external stairs (with or without device)? Independent  Functional Cognition: Did the patient need help planning regular tasks such as shopping or remembering to take medications? Independent  Patient Information    Patient's Response To:     Home Assistive Devices / Equipment Home Equipment: [place] - [employer] (pt's church has other DME if needed)  Prior Device Use: Indicate devices/aids used by the patient prior to current illness, exacerbation or injury? None of the above  Current Functional Level Cognition  Arousal/Alertness: Awake/alert Overall Cognitive Status: Impaired/Different from baseline Orientation Level: Oriented to person, Oriented to place, Disoriented to time, Oriented to situation Attention: Sustained Sustained Attention: Impaired Sustained Attention Impairment: Verbal complex Memory: Impaired Memory Impairment: Retrieval deficit Awareness: Impaired Awareness Impairment: Intellectual impairment Problem Solving:  Impaired Problem Solving Impairment: Verbal basic Executive Function: Reasoning, Sequencing Reasoning: Impaired Reasoning Impairment: Verbal basic Sequencing: Impaired Sequencing Impairment: Verbal basic    Extremity Assessment (includes Sensation/Coordination)  Upper Extremity Assessment: Overall WFL for tasks assessed (will further assess)  Lower Extremity Assessment: Defer to PT evaluation    ADLs  Overall ADL's : Needs assistance/impaired Eating/Feeding: Set up Eating/Feeding Details (indicate cue type and reason): girlfriend feeding him - educated on importance of him completing tasks on his own Grooming: Set up, Supervision/safety, Sitting Upper Body Bathing: Minimal assistance, Sitting Lower Body Bathing: Maximal assistance, Sit to/from stand Upper Body Dressing : Moderate assistance, Sitting Lower Body Dressing: Maximal assistance, Sit to/from stand Toilet Transfer: Moderate assistance, +2 for safety/equipment (simlated) Toileting- Clothing Manipulation and Hygiene: Maximal assistance Functional mobility during ADLs: Moderate assistance, +2 for physical assistance General ADL Comments: total A for all aspects of care due to pt defiance,  impaired cognition and unwillingness to participate on evaluation. Anticipate pt could functionally participate with minimal assist in most of his ADLs - unable to fully use clinical judgement based on cognition/affect presentation.    Mobility  Overal bed mobility: Needs Assistance Bed Mobility: Supine to Sit Supine to sit: Min assist, +2 for safety/equipment Sit to supine: Mod assist, +2 for physical assistance General bed mobility comments: assist for completion of LE progression to EOB, trunk elevation, pt able to scoot self to EOB. increased time and sequencing cues    Transfers  Overall transfer level: Needs assistance Equipment used: 2 person hand held assist Transfers: Sit to/from Stand, Bed to chair/wheelchair/BSC Sit to Stand:  Min assist, +2 safety/equipment Bed to/from chair/wheelchair/BSC transfer type:: Step pivot Step pivot transfers: Mod assist, +2 safety/equipment General transfer comment: distracted; able to redirect; shuffling steps to chair and requires cues for each step as pt stopping/starting multiple times    Ambulation / Gait / Stairs / Wheelchair Mobility       Posture / Balance Balance Overall balance assessment: Needs assistance Sitting-balance support: Feet supported Sitting balance-Leahy Scale: Good Standing balance support: Bilateral upper extremity supported, During functional activity Standing balance-Leahy Scale: Poor    Special considerations/life events  Oxygen 4L Nasal Cannula, Bladder incontinence, External Urinary Catheter and Skin Wound: arm/ anterior, distal, lower, right   Previous Home Environment (from acute therapy documentation) Living Arrangements: Spouse/significant other (fiance stephanie)  Lives With: Significant other Available Help at Discharge: Family, Available 24 hours/day Type of Home: House Home Layout: One level Home Access: Stairs to enter Entrance Stairs-Rails: None Entrance Stairs-Number of Steps: 4 Bathroom Shower/Tub: Hydrographic surveyor, Health Visitor: Standard  Discharge Living Setting    Social/Family/Support Systems    Goals    Decrease burden of Care through IP rehab admission: NA  Possible need for SNF placement upon discharge: Not anticipated  Patient Condition: I have reviewed medical records from [facility], spoken with CM, and patient, fiance and family member. I met with patient at the bedside for inpatient rehabilitation assessment.  Patient will benefit from ongoing PT and OT, can actively participate in 3 hours of therapy a day 5 days of the week, and can make measurable gains during the admission.  Patient will also benefit from the coordinated team approach during an Inpatient Acute Rehabilitation  admission.  The patient will receive intensive therapy as well as Rehabilitation physician, nursing, social worker, and care management interventions.  Due to bladder management, safety, skin/wound care, disease management, medication administration, pain management, and patient education the patient requires 24 hour a day rehabilitation nursing.  The patient is currently *** with mobility and basic ADLs.  Discharge setting and therapy post discharge at home with home health is anticipated.  Patient has agreed to participate in the Acute Inpatient Rehabilitation Program and will admit {Time; today/tomorrow:10263}.  Preadmission Screen Completed By:  Tinnie SHAUNNA Yvone Delayne, 09/09/2024 3:04 PM ______________________________________________________________________   Discussed status with Dr. PIERRETTE on *** at *** and received approval for admission today.  Admission Coordinator:  Tinnie SHAUNNA Yvone Delayne, CCC-SLP, time ***/Date ***   Assessment/Plan: Diagnosis: *** Does the need for close, 24 hr/day Medical supervision in concert with the patient's rehab needs make it unreasonable for this patient to be served in a less intensive setting? {yes_no_potentially:3041433} Co-Morbidities requiring supervision/potential complications: *** Due to {due un:6958565}, does the patient require 24 hr/day rehab nursing? {yes_no_potentially:3041433} Does the patient require coordinated care of a physician, rehab nurse, PT,  OT, and SLP to address physical and functional deficits in the context of the above medical diagnosis(es)? {yes_no_potentially:3041433} Addressing deficits in the following areas: {deficits:3041436} Can the patient actively participate in an intensive therapy program of at least 3 hrs of therapy 5 days a week? {yes_no_potentially:3041433} The potential for patient to make measurable gains while on inpatient rehab is {potential:3041437} Anticipated functional outcomes upon discharge from inpatient rehab:  {functional outcomes:304600100} PT, {functional outcomes:304600100} OT, {functional outcomes:304600100} SLP Estimated rehab length of stay to reach the above functional goals is: *** Anticipated discharge destination: {anticipated dc setting:21604} 10. Overall Rehab/Functional Prognosis: {potential:3041437}   MD Signature: ***

## 2024-09-09 NOTE — Progress Notes (Signed)
 Physical Therapy Treatment Patient Details Name: Clinton Phillips MRN: [id number] DOB: 06-27-1961 Today's Date: 09/09/2024   History of Present Illness 63 yo male presents to [facility] from [facility] on 11/24 with headache, AMS. CTH showed acute SAH with concern for Acomm rupture and small volume IVH with mild ventriculomegaly c/w hydrocephalus. EVD 11/24. ETT 11/24-11/25. PMHx: none.    PT Comments  RN cleared pt for mobility, as EVD flushed by neurosurgery and now working. Pt's EVD clamped for session by RN staff. Pt pleasant and cooperative today, does benefit from short one-step commands and redirection as needed. Pt progressing to short-distance gait in room x2, requiring mod +2 assist for lines/leads, balance, and correction of heavy posterior bias especially with fatigue. Pt up in chair with waist alarm belt donned, RN in room to address EVD calibration. Pt progressing well, would continue to recommend intensive inpatient therapies post-acutely.     If plan is discharge home, recommend the following: A lot of help with walking and/or transfers;A lot of help with bathing/dressing/bathroom   Can travel by private vehicle        Equipment Recommendations  None recommended by PT    Recommendations for Other Services       Precautions / Restrictions Precautions Precautions: Fall Recall of Precautions/Restrictions: Impaired Precaution/Restrictions Comments: EVD (clamped during session and RN aware pt up in chair at end of session) Restrictions Weight Bearing Restrictions Per Provider Order: No     Mobility  Bed Mobility Overal bed mobility: Needs Assistance Bed Mobility: Supine to Sit     Supine to sit: Min assist, +2 for physical assistance, +2 for safety/equipment     General bed mobility comments: assist for completion of LE progression to EOB, pt able to scoot self to EOB with cues and increased time.    Transfers Overall transfer level: Needs assistance Equipment  used: 2 person hand held assist Transfers: Sit to/from Stand Sit to Stand: Mod assist, +2 physical assistance, +2 safety/equipment           General transfer comment: posterior LOB with assist to correct and rise, cues for posture    Ambulation/Gait Ambulation/Gait assistance: Mod assist, +2 physical assistance Gait Distance (Feet): 10 Feet (x2) Assistive device: 2 person hand held assist Gait Pattern/deviations: Step-through pattern, Decreased stride length, Trunk flexed, Leaning posteriorly Gait velocity: decr     General Gait Details: heavy posterior bias especially with continued gait, assist to correct balance, steady via HHA +2, 3rd person helpful for lines/leads   Stairs             Wheelchair Mobility     Tilt Bed    Modified Rankin (Stroke Patients Only)       Balance Overall balance assessment: Needs assistance Sitting-balance support: Feet supported Sitting balance-Leahy Scale: Good     Standing balance support: Bilateral upper extremity supported, During functional activity Standing balance-Leahy Scale: Poor                              Communication Communication Communication: No apparent difficulties  Cognition Arousal: Alert Behavior During Therapy: Restless, Impulsive   PT - Cognitive impairments: Difficult to assess, Awareness, Memory, Attention, Sequencing, Problem solving, Safety/Judgement                       PT - Cognition Comments: following one-step commands, periods of repetitive movements (L/R leaning) difficult to cue out of and pt states  it's intentional. benefits from short cues Following commands: Impaired Following commands impaired: Follows one step commands with increased time    Cueing Cueing Techniques: Verbal cues, Gestural cues  Exercises      General Comments General comments (skin integrity, edema, etc.): SpO2 down to 87 on RA, placed back on 2L      Pertinent Vitals/Pain Pain  Assessment Pain Assessment: Faces Faces Pain Scale: Hurts little more Pain Location: head Pain Descriptors / Indicators: Discomfort, Grimacing, Guarding Pain Intervention(s): Limited activity within patient's tolerance, Monitored during session, Repositioned    Home Living                          Prior Function            PT Goals (current goals can now be found in the care plan section) Acute Rehab PT Goals Patient Stated Goal: home PT Goal Formulation: With patient Time For Goal Achievement: 09/20/24 Potential to Achieve Goals: Good Progress towards PT goals: Progressing toward goals    Frequency    Min 2X/week      PT Plan      Co-evaluation   Reason for Co-Treatment: Complexity of the patient's impairments (multi-system involvement);For patient/therapist safety;To address functional/ADL transfers PT goals addressed during session: Mobility/safety with mobility;Balance OT goals addressed during session: ADL's and self-care      AM-PAC PT 6 Clicks Mobility   Outcome Measure  Help needed turning from your back to your side while in a flat bed without using bedrails?: A Little Help needed moving from lying on your back to sitting on the side of a flat bed without using bedrails?: A Little Help needed moving to and from a bed to a chair (including a wheelchair)?: A Lot Help needed standing up from a chair using your arms (e.g., wheelchair or bedside chair)?: A Lot Help needed to walk in hospital room?: A Lot Help needed climbing 3-5 steps with a railing? : Total 6 Click Score: 13    End of Session Equipment Utilized During Treatment: Gait belt Activity Tolerance: Patient limited by fatigue Patient left: in chair;with call bell/phone within reach;with chair alarm set;with family/visitor present (waist posey alarm belt donned) Nurse Communication: Mobility status;Other (comment) (RN present to recalibrate EVD) PT Visit Diagnosis: Other abnormalities  of gait and mobility (R26.89);Muscle weakness (generalized) (M62.81)     Time: 1443-1510 PT Time Calculation (min) (ACUTE ONLY): 27 min  Charges:    $Gait Training: 8-22 mins PT General Charges $$ ACUTE PT VISIT: 1 Visit                     Johana RAMAN, PT DPT Acute Rehabilitation Services Secure Chat Preferred  Office [phone]    Dorean Hiebert FORBES Kingdom 09/09/2024, 4:27 PM

## 2024-09-09 NOTE — Progress Notes (Signed)
 1240:  EVD no output in last hour.  Neurosurgery paged.  CT order and completed. EVD flushed by provider. Continue to monitor output.

## 2024-09-09 NOTE — TOC Progression Note (Signed)
 Transition of Care [facility]) - Progression Note    Patient Details  Name: Clinton Phillips MRN: [id number] Date of Birth: [birth year]  Transition of [facility] CM/SW Contact  Shareena Nusz E Ellese Julius, LCSW Phone Number: 09/09/2024, 8:51 AM  Clinical Narrative:    ICM is following for CIR eligibility.                     Expected Discharge Plan and Services                                               Social Drivers of Health (SDOH) Interventions SDOH Screenings   Food Insecurity: No Food Insecurity (09/08/2024)  Housing: Low Risk  (09/08/2024)  Transportation Needs: No Transportation Needs (09/08/2024)  Utilities: Not At Risk (09/08/2024)  Tobacco Use: High Risk (09/06/2024)    Readmission Risk Interventions     No data to display

## 2024-09-09 NOTE — Progress Notes (Signed)
 Inpatient Rehab Admissions:  Inpatient Rehab Consult received.  I met with patient, fiance Corean and son Jahan at the bedside for rehabilitation assessment and to discuss goals and expectations of an inpatient rehab admission.  Discussed average length of stay, insurance authorization requirement and discharge home after completion of CIR. All acknowledged  understanding. Pt interested in pursuing CIR. Corean and Jahan supportive. Corean confirmed that she will be able to provide 24/7 support for pt after discharge. Will continue to follow.  Signed: Tinnie Yvone Cohens, MS, CCC-SLP Admissions Coordinator [phone]

## 2024-09-09 NOTE — Progress Notes (Signed)
 NAME:  Clinton Phillips, MRN:  [id number], DOB:  09/21/1961, LOS: 4 ADMISSION DATE:  09/05/2024, CONSULTATION DATE:  09/05/24 REFERRING MD:  Suzanne - [facility] CHIEF COMPLAINT:  Headache   History of Present Illness:  Pt is encephelopathic; therefore, this HPI is obtained from chart review. Clinton Phillips is a 63 y.o. male who has no significant PMH. He presented to [facility] ED 11/24 with headache that started the night prior around midnight before he went to bed.  He didn't wake up for work morning of 11/24 so family checked on him and found him to be altered.  He was taken to [facility] where [facility] showed acute SAH with concern for Acomm rupture and small volume IVH with mild ventriculomegaly c/w hydrocephalus. 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  Case was discussed with neurosurgery who recommended emergent transfer to [facility] ICU for further evaluation and management. Prior to transfer to [facility], he was intubated for airway protection.  Pertinent  Medical History:  has SAH (subarachnoid hemorrhage) (HCC) on their problem list.  Significant Hospital Events: Including procedures, antibiotic start and stop dates in addition to other pertinent events   11/24 admit, underwent EVD placement and aneurysmal coiling 09/06/24 Extubated  09/08/24 low-grade fever 100.2   Interim History / Subjective:   Patient doing well, has no complaints  Objective:  Blood pressure (!) 143/98, pulse 72, temperature 99.1 F (37.3 C), temperature source Axillary, resp. rate 18, SpO2 96%.        Intake/Output Summary (Last 24 hours) at 09/09/2024 9178 Last data filed at 09/09/2024 0800 Gross per 24 hour  Intake 560 ml  Output 772 ml  Net -212 ml    There were no vitals filed for this visit.   Physical Exam: General: Adult male awake not in distress Neuro: Alert and oriented x 3, motor and sensation intact HEENT: Normocephalic, atraumatic, EVD in place Cardiovascular: Regular rate and rhythm Lungs:  Clear to auscultation Abdomen: Soft, nontender, nondistended Musculoskeletal: No edema   Labs/imaging personally reviewed:  CT head 11/24 > acute SAH with concern for Acom rupture and small volume IVH with mild ventriculomegaly c/w hydrocephalus. CTA head 11/24 >  small Acomm aneurysm.  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  Repeat chest x-ray appeared normal yesterday.  Cardiac/Vascular  No issues  GI Diet: Regular diet GI PPX: None indicated  ID Low-grade fever once that has not recurred No Foley.,  No central IV access and only has peripheral IVs.   Renal  No issues   Endo DC Q4 fingersticks as his blood sugars have been stable  Heme/Onc DVT ppx : SCD, heparin  added   MSK/other   No issues  Dispo: EVD in place  Labs   CBC: Recent Labs  Lab 09/05/24 1101 09/05/24 1739 09/05/24 1834 09/06/24 0456 09/07/24 0216 09/08/24 0214 09/09/24 0305  WBC 7.1  --   --  7.3 8.7 5.9 5.9  HGB 12.0*   < > 11.6* 11.6* 11.2* 11.9* 12.8*  HCT 35.5*   < > 34.0* 35.9* 33.0* 35.4* 37.2*  MCV  95.9  --   --  99.4 97.1 95.2 94.4  PLT 222  --   --  251 205 204 197   < > = values in this interval not displayed.    Basic Metabolic Panel: Recent Labs  Lab 09/06/24 0456 09/06/24 1938 09/07/24 0216 09/07/24 0739 09/08/24 0214 09/09/24 0305  NA 137 138  --  135 136 136  K 5.5* 3.8  --  3.9 3.9 3.9  CL 108 106  --  103 102 101  CO2 20* 22  --  24 23 24   GLUCOSE 156* 129*  --  95 101* 135*  BUN 17 23  --  18 18 16   CREATININE  1.25* 1.25*  --  1.12 1.16 1.09  CALCIUM 8.3* 8.4*  --  8.3* 8.7* 8.7*  MG 2.0  --  1.9  --  2.0 2.1  PHOS 3.6  --  3.3  --  3.9 4.0   GFR: Estimated Creatinine Clearance: 86.9 mL/min (by C-G formula based on SCr of 1.09 mg/dL). Recent Labs  Lab 09/05/24 1101 09/06/24 0456 09/07/24 0216 09/08/24 0214 09/09/24 0305  WBC 7.1 7.3 8.7 5.9 5.9  LATICACIDVEN 2.2*  --   --   --   --     Liver Function Tests: No results for input(s): AST, ALT, ALKPHOS, BILITOT, PROT, ALBUMIN in the last 168 hours. No results for input(s): LIPASE, AMYLASE in the last 168 hours. No results for input(s): AMMONIA in the last 168 hours.  ABG    Component Value Date/Time   PHART 7.396 09/05/2024 1834   PCO2ART 40.9 09/05/2024 1834   PO2ART 288 (H) 09/05/2024 1834   HCO3 25.0 09/05/2024 1834   TCO2 26 09/05/2024 1834   ACIDBASEDEF 3.0 (H) 09/05/2024 1739   O2SAT 100 09/05/2024 1834     Coagulation Profile: Recent Labs  Lab 09/05/24 1101  INR 1.0    Cardiac Enzymes: No results for input(s): CKTOTAL, CKMB, CKMBINDEX, TROPONINI in the last 168 hours.  HbA1C: No results found for: HGBA1C  CBG: Recent Labs  Lab 09/07/24 1509 09/07/24 1912 09/07/24 2318 09/08/24 0310 09/08/24 0748  GLUCAP 124* 157* 94 101* 129*    Review of Systems:   Unable to obtain as pt is encephalopathic.  Past Medical History:  He,  has a past medical history of Anemia.   Surgical History:   Past Surgical History:  Procedure Laterality Date   IR 3D INDEPENDENT WKST  09/05/2024   IR ANGIO INTRA EXTRACRAN SEL COM CAROTID INNOMINATE UNI L MOD SED  09/05/2024   IR ANGIOGRAM FOLLOW UP STUDY  09/05/2024   IR ANGIOGRAM FOLLOW UP STUDY  09/05/2024   IR ANGIOGRAM FOLLOW UP STUDY  09/05/2024   IR CT HEAD LTD  09/05/2024   IR TRANSCATH/EMBOLIZ  09/05/2024   IR US  GUIDE VASC ACCESS RIGHT  09/05/2024   RADIOLOGY WITH ANESTHESIA N/A 09/05/2024   Procedure: RADIOLOGY WITH ANESTHESIA;   Surgeon: Radiologist, Medication, MD;  Location: [facility] OR;  Service: Radiology;  Laterality: N/A;     Social History:   reports that he has been smoking cigarettes. He has a 23 pack-year smoking history. He has never used smokeless tobacco. He reports current alcohol use of about 12.0 standard drinks of alcohol per week. He reports current drug use. Drug: Marijuana.   Family History:  His family history is not on file.   Allergies No Known Allergies   Home Medications  Prior to Admission medications   Medication Sig Start Date  End Date Taking? Authorizing Provider  cyclobenzaprine  (FLEXERIL ) 5 MG tablet Take 1-2 tablets (5-10 mg total) by mouth 3 (three) times daily as needed for muscle spasms. Patient not taking: Reported on 09/05/2024 03/18/23   Charlene Debby BROCKS, PA-C  cyclobenzaprine  (FLEXERIL ) 5 MG tablet Take 1-2 tablets (5-10 mg total) by mouth 3 (three) times daily as needed for muscle spasms. Patient not taking: Reported on 09/05/2024 03/18/23   Gaines, Thomas C, PA-C  HYDROcodone -acetaminophen  (NORCO) 5-325 MG tablet Take 1 tablet by mouth every 6 (six) hours as needed for moderate pain. Patient not taking: Reported on 09/05/2024 03/18/23   Charlene Debby BROCKS, PA-C  ondansetron  (ZOFRAN  ODT) 4 MG disintegrating tablet Take 1 tablet (4 mg total) by mouth every 8 (eight) hours as needed for nausea or vomiting. Patient not taking: Reported on 09/05/2024 01/05/21   Angelena Smalls, MD  predniSONE  (DELTASONE ) 10 MG tablet Take 1 tablet (10 mg total) by mouth daily. 6,5,4,3,2,1 six day taper Patient not taking: Reported on 09/05/2024 03/18/23   Charlene Debby BROCKS, PA-C

## 2024-09-09 NOTE — Progress Notes (Signed)
 OT Cancellation Note  Patient Details Name: Clinton Phillips MRN: [id number] DOB: 03/16/1961   Cancelled Treatment:    Reason Eval/Treat Not Completed: Medical issues which prohibited therapy (Per RN, pt's EVD is potentially malfunctioning. OT treat to follow up once given clearance.)  [name] 09/09/2024, 12:42 PM

## 2024-09-09 NOTE — Progress Notes (Signed)
 Transcranial Doppler   Date POD PCO2 HCT BP   MCA ACA PCA OPHT SIPH VERT Basilar  11/25 1   35.9 96/68 Right  Left   69  44   *  -22   16  18   15  11    *  *   -14  -23   -38       11/26   2    33 121/78  Right  Left    70   53    *   -22    21   23    17   16     *   *    -24   -23    -37       11/28   4    37  162/102 Right  Left    52   58    -27   *    22   28    25   24    29    *    -22   -24    -35                   Right  Left                                                                 Right  Left                                                               Right  Left                                                               Right  Left                                                       MCA = Middle Cerebral Artery      OPHT = Opthalmic Artery     BASILAR = Basilar Artery   ACA = Anterior Cerebral Artery     SIPH = Carotid Siphon PCA = Posterior Cerebral Artery   VERT = Verterbral Artery                    Normal MCA = 62+\-12 ACA = 50+\-12 PCA = 42+\-23    Right Lindegaard Ratio: 1.37 Left Lindegaard Ratio: 2.8

## 2024-09-09 NOTE — Progress Notes (Signed)
 PT Cancellation Note  Patient Details Name: Clinton Phillips MRN: [id number] DOB: 19-Feb-1961   Cancelled Treatment:    Reason Eval/Treat Not Completed: Medical issues which prohibited therapy - RN requesting hold given EVD not putting out for the past hour, will check back as schedule allows.  [name] S, PT DPT Acute Rehabilitation Services Secure Chat Preferred  Office [phone]    [name] Kingdom 09/09/2024, 12:43 PM

## 2024-09-10 LAB — CBC
HCT: 37.2 % — ABNORMAL LOW (ref 39.0–52.0)
Hemoglobin: 13.2 g/dL (ref 13.0–17.0)
MCH: 33.1 pg (ref 26.0–34.0)
MCHC: 35.5 g/dL (ref 30.0–36.0)
MCV: 93.2 fL (ref 80.0–100.0)
Platelets: 201 K/uL (ref 150–400)
RBC: 3.99 MIL/uL — ABNORMAL LOW (ref 4.22–5.81)
RDW: 11.9 % (ref 11.5–15.5)
WBC: 6.5 K/uL (ref 4.0–10.5)
nRBC: 0 % (ref 0.0–0.2)

## 2024-09-10 LAB — BASIC METABOLIC PANEL WITH GFR
Anion gap: 12 (ref 5–15)
BUN: 18 mg/dL (ref 8–23)
CO2: 22 mmol/L (ref 22–32)
Calcium: 9 mg/dL (ref 8.9–10.3)
Chloride: 98 mmol/L (ref 98–111)
Creatinine, Ser: 1.12 mg/dL (ref 0.61–1.24)
GFR, Estimated: >60 mL/min (ref >60)
Glucose, Bld: 118 mg/dL — ABNORMAL HIGH (ref 70–99)
Potassium: 4.2 mmol/L (ref 3.5–5.1)
Sodium: 132 mmol/L — ABNORMAL LOW (ref 135–145)

## 2024-09-10 LAB — PHOSPHORUS: Phosphorus: 3.9 mg/dL (ref 2.5–4.6)

## 2024-09-10 LAB — MAGNESIUM: Magnesium: 2.1 mg/dL (ref 1.7–2.4)

## 2024-09-10 NOTE — Progress Notes (Signed)
 NAME:  Clinton Phillips, MRN:  [id number], DOB:  06/24/1961, LOS: 5 ADMISSION DATE:  09/05/2024, CONSULTATION DATE:  09/05/24 REFERRING MD:  Suzanne - [facility] CHIEF COMPLAINT:  Headache   History of Present Illness:  Pt is encephelopathic; therefore, this HPI is obtained from chart review. Clinton Phillips is a 63 y.o. male who has no significant PMH. He presented to [facility] ED 11/24 with headache that started the night prior around midnight before he went to bed.  He didn't wake up for work morning of 11/24 so family checked on him and found him to be altered.  He was taken to [facility] where [facility] showed acute SAH with concern for Acomm rupture and small volume IVH with mild ventriculomegaly c/w hydrocephalus. 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   Underwent EVD placement and aneurysmal coiling. Extubated since then. TCDs reassuring.   Pertinent  Medical History:  has SAH (subarachnoid hemorrhage) (HCC) on their problem list.  Significant Hospital Events: Including procedures, antibiotic start and stop dates in addition to other pertinent events   11/24 admit, underwent EVD placement and aneurysmal coiling 09/06/24 Extubated  09/08/24 low-grade fever 100.2   Interim History / Subjective:   Patient has no complaints. EVD draining  Objective:  Blood pressure (!) 138/90, pulse 72, temperature (!) 100.5 F (38.1 C), temperature source Oral, resp. rate (!) 21, SpO2 98%.        Intake/Output Summary (Last 24 hours) at 09/10/2024 0818 Last data filed at 09/10/2024 0800 Gross per 24 hour  Intake 352.25 ml  Output 702 ml  Net -349.75 ml    There were no vitals filed for this visit.   Physical Exam: General: Adult male awake not in distress Neuro: Alert and oriented x 3, motor and sensation intact HEENT: Normocephalic, atraumatic, EVD in place Cardiovascular: Regular rate and rhythm Lungs: Clear to auscultation Abdomen: Soft, nontender, nondistended Musculoskeletal: No  edema   Labs/imaging personally reviewed:  CT head 11/24 > acute SAH with concern for Acom rupture and small volume IVH with mild ventriculomegaly c/w hydrocephalus. CTA head 11/24 >  small Acomm aneurysm.  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  Repeat chest x-ray appeared normal yesterday.  Cardiac/Vascular  No issues  GI Diet: Regular diet GI PPX: None indicated  ID Low-grade fever once that has not recurred No Foley.,  No central IV access and only has peripheral IVs.   Renal  No issues   Endo DC Q4 fingersticks as his blood sugars have been stable  Heme/Onc DVT ppx : SCD, heparin  added   MSK/other   No issues  Dispo: EVD in place  Labs   CBC: Recent Labs  Lab 09/06/24 0456 09/07/24 0216 09/08/24 0214 09/09/24 0305 09/10/24 0347  WBC 7.3 8.7 5.9 5.9 6.5  HGB 11.6* 11.2* 11.9* 12.8* 13.2  HCT 35.9* 33.0* 35.4* 37.2* 37.2*  MCV 99.4 97.1 95.2 94.4 93.2  PLT 251 205 204 197 201    Basic Metabolic Panel: Recent Labs  Lab 09/06/24 0456 09/06/24 1938 09/07/24 0216 09/07/24 0739 09/08/24 0214 09/09/24 0305  09/10/24 0347  NA 137 138  --  135 136 136 132*  K 5.5* 3.8  --  3.9 3.9 3.9 4.2  CL 108 106  --  103 102 101 98  CO2 20* 22  --  24 23 24 22   GLUCOSE 156* 129*  --  95 101* 135* 118*  BUN 17 23  --  18 18 16 18   CREATININE 1.25* 1.25*  --  1.12 1.16 1.09 1.12  CALCIUM 8.3* 8.4*  --  8.3* 8.7* 8.7* 9.0  MG 2.0  --  1.9  --  2.0 2.1 2.1  PHOS 3.6  --  3.3  --  3.9 4.0 3.9   GFR: Estimated Creatinine  Clearance: 84.5 mL/min (by C-G formula based on SCr of 1.12 mg/dL). Recent Labs  Lab 09/05/24 1101 09/06/24 0456 09/07/24 0216 09/08/24 0214 09/09/24 0305 09/10/24 0347  WBC 7.1   < > 8.7 5.9 5.9 6.5  LATICACIDVEN 2.2*  --   --   --   --   --    < > = values in this interval not displayed.    Liver Function Tests: No results for input(s): AST, ALT, ALKPHOS, BILITOT, PROT, ALBUMIN in the last 168 hours. No results for input(s): LIPASE, AMYLASE in the last 168 hours. No results for input(s): AMMONIA in the last 168 hours.  ABG    Component Value Date/Time   PHART 7.396 09/05/2024 1834   PCO2ART 40.9 09/05/2024 1834   PO2ART 288 (H) 09/05/2024 1834   HCO3 25.0 09/05/2024 1834   TCO2 26 09/05/2024 1834   ACIDBASEDEF 3.0 (H) 09/05/2024 1739   O2SAT 100 09/05/2024 1834     Coagulation Profile: Recent Labs  Lab 09/05/24 1101  INR 1.0    Cardiac Enzymes: No results for input(s): CKTOTAL, CKMB, CKMBINDEX, TROPONINI in the last 168 hours.  HbA1C: No results found for: HGBA1C  CBG: Recent Labs  Lab 09/07/24 1509 09/07/24 1912 09/07/24 2318 09/08/24 0310 09/08/24 0748  GLUCAP 124* 157* 94 101* 129*    Review of Systems:   Unable to obtain as pt is encephalopathic.  Past Medical History:  He,  has a past medical history of Anemia.   Surgical History:   Past Surgical History:  Procedure Laterality Date   IR 3D INDEPENDENT WKST  09/05/2024   IR ANGIO INTRA EXTRACRAN SEL COM CAROTID INNOMINATE UNI L MOD SED  09/05/2024   IR ANGIOGRAM FOLLOW UP STUDY  09/05/2024   IR ANGIOGRAM FOLLOW UP STUDY  09/05/2024   IR ANGIOGRAM FOLLOW UP STUDY  09/05/2024   IR CT HEAD LTD  09/05/2024   IR TRANSCATH/EMBOLIZ  09/05/2024   IR US  GUIDE VASC ACCESS RIGHT  09/05/2024   RADIOLOGY WITH ANESTHESIA N/A 09/05/2024   Procedure: RADIOLOGY WITH ANESTHESIA;  Surgeon: Radiologist, Medication, MD;  Location: [facility] OR;  Service: Radiology;  Laterality: N/A;      Social History:   reports that he has been smoking cigarettes. He has a 23 pack-year smoking history. He has never used smokeless tobacco. He reports current alcohol use of about 12.0 standard drinks of alcohol per week. He reports current drug use. Drug: Marijuana.   Family History:  His family history is not on file.   Allergies No Known Allergies   Home Medications  Prior to Admission medications   Medication Sig Start Date End Date Taking? Authorizing Provider  cyclobenzaprine  (FLEXERIL ) 5 MG tablet Take 1-2 tablets (5-10 mg total) by mouth 3 (three) times daily as needed  for muscle spasms. Patient not taking: Reported on 09/05/2024 03/18/23   Charlene Debby BROCKS, PA-C  cyclobenzaprine  (FLEXERIL ) 5 MG tablet Take 1-2 tablets (5-10 mg total) by mouth 3 (three) times daily as needed for muscle spasms. Patient not taking: Reported on 09/05/2024 03/18/23   Gaines, Thomas C, PA-C  HYDROcodone -acetaminophen  (NORCO) 5-325 MG tablet Take 1 tablet by mouth every 6 (six) hours as needed for moderate pain. Patient not taking: Reported on 09/05/2024 03/18/23   Charlene Debby BROCKS, PA-C  ondansetron  (ZOFRAN  ODT) 4 MG disintegrating tablet Take 1 tablet (4 mg total) by mouth every 8 (eight) hours as needed for nausea or vomiting. Patient not taking: Reported on 09/05/2024 01/05/21   Angelena Smalls, MD  predniSONE  (DELTASONE ) 10 MG tablet Take 1 tablet (10 mg total) by mouth daily. 6,5,4,3,2,1 six day taper Patient not taking: Reported on 09/05/2024 03/18/23   Charlene Debby BROCKS, PA-C

## 2024-09-11 LAB — BASIC METABOLIC PANEL WITH GFR
Anion gap: 10 (ref 5–15)
BUN: 22 mg/dL (ref 8–23)
CO2: 22 mmol/L (ref 22–32)
Calcium: 8.7 mg/dL — ABNORMAL LOW (ref 8.9–10.3)
Chloride: 96 mmol/L — ABNORMAL LOW (ref 98–111)
Creatinine, Ser: 1.21 mg/dL (ref 0.61–1.24)
GFR, Estimated: >60 mL/min (ref >60)
Glucose, Bld: 109 mg/dL — ABNORMAL HIGH (ref 70–99)
Potassium: 4.3 mmol/L (ref 3.5–5.1)
Sodium: 128 mmol/L — ABNORMAL LOW (ref 135–145)

## 2024-09-11 NOTE — Progress Notes (Signed)
 NAME:  Clinton Phillips, MRN:  [id number], DOB:  [birth year], LOS: 6 ADMISSION DATE:  09/05/2024, CONSULTATION DATE:  09/05/24 REFERRING MD:  Suzanne - [facility] CHIEF COMPLAINT:  Headache   History of Present Illness:  Pt is encephelopathic; therefore, this HPI is obtained from chart review. Clinton Phillips is a 63 y.o. male who has no significant PMH. He presented to [facility] ED 11/24 with headache that started the night prior around midnight before he went to bed.  He didn't wake up for work morning of 11/24 so family checked on him and found him to be altered.  He was taken to [facility] where [facility] showed acute SAH with concern for Acomm rupture and small volume IVH with mild ventriculomegaly c/w hydrocephalus. 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   Underwent EVD placement and aneurysmal coiling. Extubated since then. TCDs reassuring.   Pertinent  Medical History:  has SAH (subarachnoid hemorrhage) (HCC) on their problem list.  Significant Hospital Events: Including procedures, antibiotic start and stop dates in addition to other pertinent events   11/24 admit, underwent EVD placement and aneurysmal coiling 09/06/24 Extubated  09/08/24 low-grade fever 100.2   Interim History / Subjective:   Patient has no complaints. EVD draining  Objective:  Blood pressure 126/78, pulse 83, temperature 99.5 F (37.5 C), temperature source Oral, resp. rate (!) 24, SpO2 92%.        Intake/Output Summary (Last 24 hours) at 09/11/2024 0847 Last data filed at 09/11/2024 0800 Gross per 24 hour  Intake --  Output 1208 ml  Net -1208 ml    There were no vitals filed for this visit.   Physical Exam: General: Adult male resting comfortably in bed Neuro: Alert and oriented x 3, motor and sensation intact HEENT: Normocephalic, atraumatic, EVD in place Cardiovascular: Regular rate and rhythm Lungs: Clear to auscultation Abdomen: Soft, nontender, nondistended Musculoskeletal: No  edema   Labs/imaging personally reviewed:  CT head 11/24 > acute SAH with concern for Acom rupture and small volume IVH with mild ventriculomegaly c/w hydrocephalus. CTA head 11/24 >  small Acomm aneurysm.  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  Repeat chest x-ray appeared normal yesterday.  Cardiac/Vascular  No issues  GI Diet: Regular diet GI PPX: None indicated  ID Low-grade fever once that has not recurred No Foley.,  No central IV access and only has peripheral IVs.   Renal  No issues   Endo DC Q4 fingersticks as his blood sugars have been stable  Heme/Onc DVT ppx : SCD, heparin  added   MSK/other   No issues  Dispo: EVD in place  Labs   CBC: Recent Labs  Lab 09/06/24 0456 09/07/24 0216 09/08/24 0214 09/09/24 0305 09/10/24 0347  WBC 7.3 8.7 5.9 5.9 6.5  HGB 11.6* 11.2* 11.9* 12.8* 13.2  HCT 35.9* 33.0* 35.4* 37.2* 37.2*  MCV 99.4 97.1 95.2 94.4 93.2  PLT 251 205 204 197 201    Basic Metabolic Panel: Recent Labs  Lab 09/06/24 0456 09/06/24 1938 09/07/24 0216 09/07/24 0739 09/08/24 0214 09/09/24 0305 09/10/24  9652 09/11/24 0330  NA 137   < >  --  135 136 136 132* 128*  K 5.5*   < >  --  3.9 3.9 3.9 4.2 4.3  CL 108   < >  --  103 102 101 98 96*  CO2 20*   < >  --  24 23 24 22 22   GLUCOSE 156*   < >  --  95 101* 135* 118* 109*  BUN 17   < >  --  18 18 16 18 22   CREATININE 1.25*   < >  --  1.12 1.16 1.09 1.12 1.21  CALCIUM 8.3*   < >  --  8.3* 8.7* 8.7* 9.0 8.7*  MG 2.0  --  1.9  --  2.0 2.1 2.1   --   PHOS 3.6  --  3.3  --  3.9 4.0 3.9  --    < > = values in this interval not displayed.   GFR: Estimated Creatinine Clearance: 78.3 mL/min (by C-G formula based on SCr of 1.21 mg/dL). Recent Labs  Lab 09/05/24 1101 09/06/24 0456 09/07/24 0216 09/08/24 0214 09/09/24 0305 09/10/24 0347  WBC 7.1   < > 8.7 5.9 5.9 6.5  LATICACIDVEN 2.2*  --   --   --   --   --    < > = values in this interval not displayed.    Liver Function Tests: No results for input(s): AST, ALT, ALKPHOS, BILITOT, PROT, ALBUMIN in the last 168 hours. No results for input(s): LIPASE, AMYLASE in the last 168 hours. No results for input(s): AMMONIA in the last 168 hours.  ABG    Component Value Date/Time   PHART 7.396 09/05/2024 1834   PCO2ART 40.9 09/05/2024 1834   PO2ART 288 (H) 09/05/2024 1834   HCO3 25.0 09/05/2024 1834   TCO2 26 09/05/2024 1834   ACIDBASEDEF 3.0 (H) 09/05/2024 1739   O2SAT 100 09/05/2024 1834     Coagulation Profile: Recent Labs  Lab 09/05/24 1101  INR 1.0    Cardiac Enzymes: No results for input(s): CKTOTAL, CKMB, CKMBINDEX, TROPONINI in the last 168 hours.  HbA1C: No results found for: HGBA1C  CBG: Recent Labs  Lab 09/07/24 1509 09/07/24 1912 09/07/24 2318 09/08/24 0310 09/08/24 0748  GLUCAP 124* 157* 94 101* 129*    Review of Systems:   Unable to obtain as pt is encephalopathic.  Past Medical History:  He,  has a past medical history of Anemia.   Surgical History:   Past Surgical History:  Procedure Laterality Date   IR 3D INDEPENDENT WKST  09/05/2024   IR ANGIO INTRA EXTRACRAN SEL COM CAROTID INNOMINATE UNI L MOD SED  09/05/2024   IR ANGIOGRAM FOLLOW UP STUDY  09/05/2024   IR ANGIOGRAM FOLLOW UP STUDY  09/05/2024   IR ANGIOGRAM FOLLOW UP STUDY  09/05/2024   IR CT HEAD LTD  09/05/2024   IR TRANSCATH/EMBOLIZ  09/05/2024   IR US  GUIDE VASC ACCESS RIGHT  09/05/2024   RADIOLOGY WITH ANESTHESIA N/A 09/05/2024   Procedure:  RADIOLOGY WITH ANESTHESIA;  Surgeon: Radiologist, Medication, MD;  Location: [facility] OR;  Service: Radiology;  Laterality: N/A;     Social History:   reports that he has been smoking cigarettes. He has a 23 pack-year smoking history. He has never used smokeless tobacco. He reports current alcohol use of about 12.0 standard drinks of alcohol per week. He reports current drug use. Drug: Marijuana.   Family History:  His family history  is not on file.   Allergies No Known Allergies   Home Medications  Prior to Admission medications   Medication Sig Start Date End Date Taking? Authorizing Provider  cyclobenzaprine  (FLEXERIL ) 5 MG tablet Take 1-2 tablets (5-10 mg total) by mouth 3 (three) times daily as needed for muscle spasms. Patient not taking: Reported on 09/05/2024 03/18/23   Charlene Debby BROCKS, PA-C  cyclobenzaprine  (FLEXERIL ) 5 MG tablet Take 1-2 tablets (5-10 mg total) by mouth 3 (three) times daily as needed for muscle spasms. Patient not taking: Reported on 09/05/2024 03/18/23   Gaines, Thomas C, PA-C  HYDROcodone -acetaminophen  (NORCO) 5-325 MG tablet Take 1 tablet by mouth every 6 (six) hours as needed for moderate pain. Patient not taking: Reported on 09/05/2024 03/18/23   Charlene Debby BROCKS, PA-C  ondansetron  (ZOFRAN  ODT) 4 MG disintegrating tablet Take 1 tablet (4 mg total) by mouth every 8 (eight) hours as needed for nausea or vomiting. Patient not taking: Reported on 09/05/2024 01/05/21   Angelena Smalls, MD  predniSONE  (DELTASONE ) 10 MG tablet Take 1 tablet (10 mg total) by mouth daily. 6,5,4,3,2,1 six day taper Patient not taking: Reported on 09/05/2024 03/18/23   Charlene Debby BROCKS, PA-C

## 2024-09-11 NOTE — Progress Notes (Signed)
 Rosslyn M.D made aware of possible clogged EVD. Troubleshooting measures were attempted and unsuccessful. Received verbal order to clamp EVD for the night and closely monitor patient with the possibility of EVD removal in the morning.

## 2024-09-12 ENCOUNTER — Inpatient Hospital Stay (HOSPITAL_COMMUNITY)

## 2024-09-12 DIAGNOSIS — E871 Hypo-osmolality and hyponatremia: Secondary | ICD-10-CM

## 2024-09-12 DIAGNOSIS — I609 Nontraumatic subarachnoid hemorrhage, unspecified: Secondary | ICD-10-CM

## 2024-09-12 DIAGNOSIS — G911 Obstructive hydrocephalus: Secondary | ICD-10-CM

## 2024-09-12 LAB — BASIC METABOLIC PANEL WITH GFR
Anion gap: 13 (ref 5–15)
BUN: 21 mg/dL (ref 8–23)
CO2: 19 mmol/L — ABNORMAL LOW (ref 22–32)
Calcium: 8.9 mg/dL (ref 8.9–10.3)
Chloride: 97 mmol/L — ABNORMAL LOW (ref 98–111)
Creatinine, Ser: 1.19 mg/dL (ref 0.61–1.24)
GFR, Estimated: >60 mL/min (ref >60)
Glucose, Bld: 156 mg/dL — ABNORMAL HIGH (ref 70–99)
Potassium: 4 mmol/L (ref 3.5–5.1)
Sodium: 129 mmol/L — ABNORMAL LOW (ref 135–145)

## 2024-09-12 LAB — SODIUM
Sodium: 133 mmol/L — ABNORMAL LOW (ref 135–145)
Sodium: 132 mmol/L — ABNORMAL LOW (ref 135–145)
Sodium: 133 mmol/L — ABNORMAL LOW (ref 135–145)

## 2024-09-12 LAB — GLUCOSE, CAPILLARY: Glucose-Capillary: 136 mg/dL — ABNORMAL HIGH (ref 70–99)

## 2024-09-12 MED ORDER — LABETALOL HCL 5 MG/ML IV SOLN
10.0000 mg | INTRAVENOUS | Status: AC | PRN
  Administered 2024-09-13 (×2): 10 mg via INTRAVENOUS
  Administered 2024-09-13 (×2): 20 mg via INTRAVENOUS
  Administered 2024-09-13: 10 mg via INTRAVENOUS
  Administered 2024-09-13 – 2024-09-14 (×3): 20 mg via INTRAVENOUS
  Administered 2024-09-14 (×2): 10 mg via INTRAVENOUS
  Filled 2024-09-12 (×9): qty 4

## 2024-09-12 MED ORDER — LIDOCAINE-EPINEPHRINE 1 %-1:100000 IJ SOLN
20.0000 mL | Freq: Once | INTRAMUSCULAR | Status: DC
  Filled 2024-09-12: qty 1

## 2024-09-12 MED ORDER — MELATONIN 3 MG PO TABS
3.0000 mg | ORAL_TABLET | Freq: Every evening | ORAL | Status: DC | PRN
  Administered 2024-09-13 – 2024-09-14 (×2): 3 mg via ORAL
  Filled 2024-09-12 (×2): qty 1

## 2024-09-12 MED ORDER — SODIUM CHLORIDE 0.9 % IV BOLUS
1000.0000 mL | Freq: Once | INTRAVENOUS | Status: AC
  Administered 2024-09-12: 1000 mL via INTRAVENOUS

## 2024-09-12 MED ORDER — SODIUM CHLORIDE 3 % IV BOLUS
250.0000 mL | Freq: Once | INTRAVENOUS | Status: AC
  Administered 2024-09-12: 250 mL via INTRAVENOUS
  Filled 2024-09-12: qty 500

## 2024-09-12 MED ORDER — SODIUM CHLORIDE 1 G PO TABS
1.0000 g | ORAL_TABLET | Freq: Three times a day (TID) | ORAL | Status: DC
  Administered 2024-09-12 – 2024-09-15 (×9): 1 g via ORAL
  Filled 2024-09-12 (×9): qty 1

## 2024-09-12 NOTE — Progress Notes (Signed)
 Inpatient Rehab Admissions Coordinator:  Saw pt at bedside. Pt not medically ready for CIR admission. Will continue to follow.   Tinnie Yvone Cohens, MS, CCC-SLP Admissions Coordinator [phone]

## 2024-09-12 NOTE — Plan of Care (Signed)
 Problem: Education: Goal: Knowledge of General Education information will improve Description: Including pain rating scale, medication(s)/side effects and non-pharmacologic comfort measures Outcome: Progressing   Problem: Health Behavior/Discharge Planning: Goal: Ability to manage health-related needs will improve Outcome: Progressing   Problem: Clinical Measurements: Goal: Ability to maintain clinical measurements within normal limits will improve Outcome: Progressing Goal: Will remain free from infection Outcome: Progressing Goal: Diagnostic test results will improve Outcome: Progressing Goal: Respiratory complications will improve Outcome: Progressing Goal: Cardiovascular complication will be avoided Outcome: Progressing   Problem: Activity: Goal: Risk for activity intolerance will decrease Outcome: Progressing   Problem: Nutrition: Goal: Adequate nutrition will be maintained Outcome: Progressing   Problem: Coping: Goal: Level of anxiety will decrease Outcome: Progressing   Problem: Elimination: Goal: Will not experience complications related to bowel motility Outcome: Progressing Goal: Will not experience complications related to urinary retention Outcome: Progressing   Problem: Pain Managment: Goal: General experience of comfort will improve and/or be controlled Outcome: Progressing   Problem: Safety: Goal: Ability to remain free from injury will improve Outcome: Progressing   Problem: Skin Integrity: Goal: Risk for impaired skin integrity will decrease Outcome: Progressing   Problem: Activity: Goal: Ability to tolerate increased activity will improve Outcome: Progressing   Problem: Respiratory: Goal: Ability to maintain a clear airway and adequate ventilation will improve Outcome: Progressing   Problem: Role Relationship: Goal: Method of communication will improve Outcome: Progressing   Problem: Education: Goal: Knowledge of disease or condition  will improve Outcome: Progressing Goal: Knowledge of secondary prevention will improve (MUST DOCUMENT ALL) Outcome: Progressing Goal: Knowledge of patient specific risk factors will improve (DELETE if not current risk factor) Outcome: Progressing   Problem: Spontaneous Subarachnoid Hemorrhage Tissue Perfusion: Goal: Complications of Spontaneous Subarachnoid Hemorrhage will be minimized Outcome: Progressing   Problem: Coping: Goal: Will verbalize positive feelings about self Outcome: Progressing Goal: Will identify appropriate support needs Outcome: Progressing   Problem: Health Behavior/Discharge Planning: Goal: Ability to manage health-related needs will improve Outcome: Progressing Goal: Goals will be collaboratively established with patient/family Outcome: Progressing   Problem: Self-Care: Goal: Ability to participate in self-care as condition permits will improve Outcome: Progressing Goal: Verbalization of feelings and concerns over difficulty with self-care will improve Outcome: Progressing Goal: Ability to communicate needs accurately will improve Outcome: Progressing   Problem: Nutrition: Goal: Risk of aspiration will decrease Outcome: Progressing Goal: Dietary intake will improve Outcome: Progressing   Problem: Education: Goal: Understanding of CV disease, CV risk reduction, and recovery process will improve Outcome: Progressing Goal: Individualized Educational Video(s) Outcome: Progressing   Problem: Activity: Goal: Ability to return to baseline activity level will improve Outcome: Progressing   Problem: Cardiovascular: Goal: Ability to achieve and maintain adequate cardiovascular perfusion will improve Outcome: Progressing Goal: Vascular access site(s) Level 0-1 will be maintained Outcome: Progressing   Problem: Education: Goal: Knowledge of disease or condition will improve Outcome: Progressing Goal: Knowledge of secondary prevention will improve  (MUST DOCUMENT ALL) Outcome: Progressing Goal: Knowledge of patient specific risk factors will improve (DELETE if not current risk factor) Outcome: Progressing   Problem: Spontaneous Subarachnoid Hemorrhage Tissue Perfusion: Goal: Complications of Spontaneous Subarachnoid Hemorrhage will be minimized Outcome: Progressing   Problem: Coping: Goal: Will verbalize positive feelings about self Outcome: Progressing Goal: Will identify appropriate support needs Outcome: Progressing   Problem: Health Behavior/Discharge Planning: Goal: Ability to manage health-related needs will improve Outcome: Progressing Goal: Goals will be collaboratively established with patient/family Outcome: Progressing   Problem: Self-Care: Goal: Ability to participate  in self-care as condition permits will improve Outcome: Progressing Goal: Verbalization of feelings and concerns over difficulty with self-care will improve Outcome: Progressing Goal: Ability to communicate needs accurately will improve Outcome: Progressing   Problem: Nutrition: Goal: Risk of aspiration will decrease Outcome: Progressing Goal: Dietary intake will improve Outcome: Progressing

## 2024-09-12 NOTE — Progress Notes (Signed)
 Occupational Therapy Treatment Patient Details Name: Clinton Phillips MRN: [id number] DOB: [birth year] Today's Date: 09/12/2024   History of present illness 63 yo male presents to [facility] from [facility] on 11/24 with headache, AMS. CTH showed acute SAH with concern for Acomm rupture and small volume IVH with mild ventriculomegaly c/w hydrocephalus. EVD 11/24-12/1. ETT 11/24-11/25. PMHx: none.   OT comments  Pt is making steady progress towards their acute OT goals. EVD drain pulled this morning. VSS on RA throughout, no report of HA during session. Overall pt continues to be limited by cognition, balance and activity tolerance. He required CGA-mod A +2 at times for functional mobility due to posterior bias and scissoring gait with LOB. Pt required simple 1 step cues with repletion for command following, he is self-distracting and often jokes to deflect. OT to continue to follow acutely to facilitate progress towards established goals. Pt will continue to benefit from intensive inpatient follow up therapy, >3 hours/day after discharge.         If plan is discharge home, recommend the following:  A lot of help with walking and/or transfers;A lot of help with bathing/dressing/bathroom;Assistance with cooking/housework;Assist for transportation   Equipment Recommendations  Other (comment)    Recommendations for Other Services Rehab consult    Precautions / Restrictions Precautions Precautions: Fall Recall of Precautions/Restrictions: Impaired Restrictions Weight Bearing Restrictions Per Provider Order: No       Mobility Bed Mobility Overal bed mobility: Needs Assistance Bed Mobility: Supine to Sit     Supine to sit: Min assist, +2 for physical assistance, +2 for safety/equipment          Transfers Overall transfer level: Needs assistance Equipment used: 2 person hand held assist Transfers: Sit to/from Stand Sit to Stand: Min assist, +2 physical assistance, +2  safety/equipment           General transfer comment: min A +2 for initial STS, CGA +2 for subsequent stands     Balance Overall balance assessment: Needs assistance Sitting-balance support: Feet supported Sitting balance-Leahy Scale: Good     Standing balance support: Bilateral upper extremity supported, During functional activity Standing balance-Leahy Scale: Poor                             ADL either performed or assessed with clinical judgement   ADL Overall ADL's : Needs assistance/impaired                 Upper Body Dressing : Minimal assistance       Toilet Transfer: Minimal assistance Toilet Transfer Details (indicate cue type and reason): 2 HHA, progressed to hallway mobility         Functional mobility during ADLs: Minimal assistance;+2 for safety/equipment;+2 for physical assistance General ADL Comments: continues to require cues for initiation and sequencing of tasks. he is self-distracting at time. poor balance, posterior bias    Extremity/Trunk Assessment Upper Extremity Assessment Upper Extremity Assessment: Defer to OT evaluation   Lower Extremity Assessment Lower Extremity Assessment: Defer to PT evaluation        Vision   Vision Assessment?: Wears glasses for reading   Perception Perception Perception: Within Functional Limits   Praxis Praxis Praxis: [facility]   Communication Communication Communication: No apparent difficulties Factors Affecting Communication: Hearing impaired   Cognition Arousal: Alert Behavior During Therapy: Restless, Impulsive Cognition: Cognition impaired   Orientation impairments: Time, Situation Awareness: Intellectual awareness impaired, Online awareness impaired Memory impairment (select all  impairments): Short-term memory, Working civil service fast streamer, Conservation officer, historic buildings Attention impairment (select first level of impairment): Sustained attention Executive functioning impairment (select all  impairments): Initiation, Organization, Sequencing, Reasoning, Problem solving OT - Cognition Comments: continues to joke (deflecing?), needs significant cues, follows 1 step commands. limited insight to impiarments, perseverating on soreness in legs                 Following commands: Impaired Following commands impaired: Follows one step commands with increased time      Cueing   Cueing Techniques: Verbal cues, Gestural cues        General Comments VSS on RA    Pertinent Vitals/ Pain       Pain Assessment Pain Assessment: Faces Faces Pain Scale: Hurts a little bit Pain Location: back, legs, head Pain Descriptors / Indicators: Discomfort, Grimacing, Guarding Pain Intervention(s): Limited activity within patient's tolerance, Monitored during session   Frequency  Min 2X/week        Progress Toward Goals  OT Goals(current goals can now be found in the care plan section)  Progress towards OT goals: Progressing toward goals  Acute Rehab OT Goals Patient Stated Goal: to feel better OT Goal Formulation: With patient Time For Goal Achievement: 09/20/24 Potential to Achieve Goals: Good ADL Goals Pt Will Perform Grooming: with supervision;standing Pt Will Perform Upper Body Dressing: with supervision Pt Will Perform Lower Body Dressing: with supervision;sit to/from stand Pt Will Transfer to Toilet: with supervision;ambulating Additional ADL Goal #1: Pt will indep sequence through a basic ADL task  Plan      Co-evaluation    PT/OT/SLP Co-Evaluation/Treatment: Yes     OT goals addressed during session: ADL's and self-care      AM-PAC OT 6 Clicks Daily Activity     Outcome Measure   Help from another person eating meals?: A Little Help from another person taking care of personal grooming?: A Little Help from another person toileting, which includes using toliet, bedpan, or urinal?: A Lot Help from another person bathing (including washing, rinsing,  drying)?: A Lot Help from another person to put on and taking off regular upper body clothing?: A Lot Help from another person to put on and taking off regular lower body clothing?: A Lot 6 Click Score: 14    End of Session Equipment Utilized During Treatment: Gait belt  OT Visit Diagnosis: Unsteadiness on feet (R26.81);Other abnormalities of gait and mobility (R26.89);Muscle weakness (generalized) (M62.81);Pain   Activity Tolerance Patient tolerated treatment well   Patient Left in chair;with call bell/phone within reach;with chair alarm set;with family/visitor present   Nurse Communication Mobility status        Time: 1455-1521 OT Time Calculation (min): 26 min  Charges: OT General Charges $OT Visit: 1 Visit OT Treatments $Therapeutic Activity: 8-22 mins  Lucie Kendall, OTR/L Acute Rehabilitation Services Office [phone] Secure Chat Communication Preferred   [name] 09/12/2024, 3:43 PM

## 2024-09-12 NOTE — Progress Notes (Signed)
 Last night, I was called and told that despite my attempt to flush his drain, it had occluded again.  His daily output was about 70 cc per shift and I decided to leave it clamped.  This morning's CAT scan did not show any significant increase in ventricular size (previous CAT scans were done at the time that he is ventricles were being drained). Therefore, I decided, given his good neurological condition, to remove the drain and to see whether he clinically or radiographically worsens. If this is the case, then I can re-insert a drain.  He is going to get a CT scan tomorrow and depending on the findings there of as well as his neurological examination. We will make that determination.

## 2024-09-12 NOTE — Plan of Care (Addendum)
 0840:  Dr. Janjua at bedside.  EVD removed by Dr. Janjua @ [phone].  Verbal order for q hourly neuro checks for 12 hours. See provider note.  1205:  Bedside TCD completed.  See results.  1445:  PT at bedside with pt.  Pt ambulated around unit.  See PT note.     Problem: Education: Goal: Knowledge of General Education information will improve Description: Including pain rating scale, medication(s)/side effects and non-pharmacologic comfort measures Outcome: Progressing   Problem: Health Behavior/Discharge Planning: Goal: Ability to manage health-related needs will improve Outcome: Progressing   Problem: Clinical Measurements: Goal: Ability to maintain clinical measurements within normal limits will improve Outcome: Progressing Goal: Will remain free from infection Outcome: Progressing Goal: Diagnostic test results will improve Outcome: Progressing Goal: Respiratory complications will improve Outcome: Progressing Goal: Cardiovascular complication will be avoided Outcome: Progressing   Problem: Activity: Goal: Risk for activity intolerance will decrease Outcome: Progressing   Problem: Nutrition: Goal: Adequate nutrition will be maintained Outcome: Progressing   Problem: Coping: Goal: Level of anxiety will decrease Outcome: Progressing   Problem: Elimination: Goal: Will not experience complications related to bowel motility Outcome: Progressing Goal: Will not experience complications related to urinary retention Outcome: Progressing   Problem: Pain Managment: Goal: General experience of comfort will improve and/or be controlled Outcome: Progressing   Problem: Safety: Goal: Ability to remain free from injury will improve Outcome: Progressing   Problem: Skin Integrity: Goal: Risk for impaired skin integrity will decrease Outcome: Progressing   Problem: Activity: Goal: Ability to tolerate increased activity will improve Outcome: Progressing   Problem:  Respiratory: Goal: Ability to maintain a clear airway and adequate ventilation will improve Outcome: Progressing   Problem: Role Relationship: Goal: Method of communication will improve Outcome: Progressing   Problem: Education: Goal: Knowledge of disease or condition will improve Outcome: Progressing Goal: Knowledge of secondary prevention will improve (MUST DOCUMENT ALL) Outcome: Progressing Goal: Knowledge of patient specific risk factors will improve (DELETE if not current risk factor) Outcome: Progressing   Problem: Spontaneous Subarachnoid Hemorrhage Tissue Perfusion: Goal: Complications of Spontaneous Subarachnoid Hemorrhage will be minimized Outcome: Progressing   Problem: Coping: Goal: Will verbalize positive feelings about self Outcome: Progressing Goal: Will identify appropriate support needs Outcome: Progressing   Problem: Health Behavior/Discharge Planning: Goal: Ability to manage health-related needs will improve Outcome: Progressing Goal: Goals will be collaboratively established with patient/family Outcome: Progressing   Problem: Self-Care: Goal: Ability to participate in self-care as condition permits will improve Outcome: Progressing Goal: Verbalization of feelings and concerns over difficulty with self-care will improve Outcome: Progressing Goal: Ability to communicate needs accurately will improve Outcome: Progressing   Problem: Nutrition: Goal: Risk of aspiration will decrease Outcome: Progressing Goal: Dietary intake will improve Outcome: Progressing   Problem: Education: Goal: Understanding of CV disease, CV risk reduction, and recovery process will improve Outcome: Progressing Goal: Individualized Educational Video(s) Outcome: Progressing   Problem: Activity: Goal: Ability to return to baseline activity level will improve Outcome: Progressing   Problem: Cardiovascular: Goal: Ability to achieve and maintain adequate cardiovascular  perfusion will improve Outcome: Progressing Goal: Vascular access site(s) Level 0-1 will be maintained Outcome: Progressing   Problem: Health Behavior/Discharge Planning: Goal: Ability to safely manage health-related needs after discharge will improve Outcome: Progressing

## 2024-09-12 NOTE — Progress Notes (Signed)
 Physical Therapy Treatment Patient Details Name: Clinton Phillips MRN: [id number] DOB: [birth year] Today's Date: 09/12/2024   History of Present Illness 63 yo male presents to [facility] from [facility] on 11/24 with headache, AMS. CTH showed acute SAH with concern for Acomm rupture and small volume IVH with mild ventriculomegaly c/w hydrocephalus. EVD 11/24-12/1. ETT 11/24-11/25. PMHx: none.    PT Comments  Pt progressing to hallway gait this date, demonstrating ataxia with LE incoordination L>R and heavy posterior bias especially notable with fatigue. Pt often using humor to deflect and minimize deficits, so can be difficult to discern true deficits at times. Pt requiring min +2 assist for balance during gait, and multiple rest breaks to recover fatigue. Pt remains a great intensive therapies candidate post-acutely.    BP 159/90 post-gait, HR 80s-110s    If plan is discharge home, recommend the following: A lot of help with walking and/or transfers;A lot of help with bathing/dressing/bathroom   Can travel by private vehicle        Equipment Recommendations  None recommended by PT    Recommendations for Other Services       Precautions / Restrictions Precautions Precautions: Fall Recall of Precautions/Restrictions: Impaired Precaution/Restrictions Comments: EVD d/c 12/1 Restrictions Weight Bearing Restrictions Per Provider Order: No     Mobility  Bed Mobility Overal bed mobility: Needs Assistance Bed Mobility: Supine to Sit     Supine to sit: Min assist, +2 for physical assistance, +2 for safety/equipment          Transfers Overall transfer level: Needs assistance Equipment used: 2 person hand held assist Transfers: Sit to/from Stand Sit to Stand: Min assist, +2 physical assistance, +2 safety/equipment           General transfer comment: min A +2 for initial STS, CGA +2 for subsequent stands    Ambulation/Gait Ambulation/Gait assistance: Mod assist, +2  physical assistance Gait Distance (Feet): 40 Feet (+80+120) Assistive device: 2 person hand held assist Gait Pattern/deviations: Step-through pattern, Decreased stride length, Trunk flexed, Leaning posteriorly, Ataxic, Scissoring Gait velocity: decr     General Gait Details: heavy posterior bias and periods of L lateral leaning, ataxic gait L>R with scissoring.   Stairs             Wheelchair Mobility     Tilt Bed    Modified Rankin (Stroke Patients Only)       Balance Overall balance assessment: Needs assistance Sitting-balance support: Feet supported Sitting balance-Leahy Scale: Good     Standing balance support: Bilateral upper extremity supported, During functional activity Standing balance-Leahy Scale: Poor                              Communication Communication Communication: No apparent difficulties Factors Affecting Communication: Hearing impaired  Cognition Arousal: Alert Behavior During Therapy: Restless, Impulsive   PT - Cognitive impairments: Difficult to assess, Awareness, Memory, Attention, Sequencing, Problem solving, Safety/Judgement                       PT - Cognition Comments: restless, poor insight into deficits. uses humor to cover difficulties with cognition at times Following commands: Impaired Following commands impaired: Follows one step commands with increased time    Cueing Cueing Techniques: Verbal cues, Gestural cues  Exercises      General Comments General comments (skin integrity, edema, etc.): BP 151/90 post-gait, HR 80s-110s      Pertinent Vitals/Pain Pain Assessment  Pain Assessment: Faces Faces Pain Scale: Hurts little more Pain Location: back, legs, head Pain Descriptors / Indicators: Discomfort, Grimacing, Guarding, Cramping Pain Intervention(s): Limited activity within patient's tolerance, Monitored during session, Repositioned    Home Living                          Prior  Function            PT Goals (current goals can now be found in the care plan section) Acute Rehab PT Goals Patient Stated Goal: home PT Goal Formulation: With patient Time For Goal Achievement: 09/20/24 Potential to Achieve Goals: Good Progress towards PT goals: Progressing toward goals    Frequency    Min 2X/week      PT Plan      Co-evaluation PT/OT/SLP Co-Evaluation/Treatment: Yes Reason for Co-Treatment: Complexity of the patient's impairments (multi-system involvement);For patient/therapist safety;To address functional/ADL transfers PT goals addressed during session: Mobility/safety with mobility;Balance OT goals addressed during session: ADL's and self-care      AM-PAC PT 6 Clicks Mobility   Outcome Measure  Help needed turning from your back to your side while in a flat bed without using bedrails?: A Little Help needed moving from lying on your back to sitting on the side of a flat bed without using bedrails?: A Little Help needed moving to and from a bed to a chair (including a wheelchair)?: A Little Help needed standing up from a chair using your arms (e.g., wheelchair or bedside chair)?: A Little Help needed to walk in hospital room?: A Lot Help needed climbing 3-5 steps with a railing? : Total 6 Click Score: 15    End of Session Equipment Utilized During Treatment: Gait belt Activity Tolerance: Patient limited by fatigue Patient left: in chair;with call bell/phone within reach;with chair alarm set;with family/visitor present (posey alarm belt) Nurse Communication: Mobility status PT Visit Diagnosis: Other abnormalities of gait and mobility (R26.89);Muscle weakness (generalized) (M62.81)     Time: 1455-1520 PT Time Calculation (min) (ACUTE ONLY): 25 min  Charges:    $Gait Training: 8-22 mins PT General Charges $$ ACUTE PT VISIT: 1 Visit                     Johana RAMAN, PT DPT Acute Rehabilitation Services Secure Chat Preferred  Office  [phone]    Kapena Hamme E Johna 09/12/2024, 4:14 PM

## 2024-09-12 NOTE — Progress Notes (Signed)
 Transcranial Doppler   Date POD PCO2 HCT BP   MCA ACA PCA OPHT SIPH VERT Basilar  11/25 1   35.9 96/68 Right  Left   69  44   *  -22   16  18   15  11    *  *   -14  -23   -38       11/26   2    33 121/78  Right  Left    70   53    *   -22    21   23    17   16     *   *    -24   -23    -37       11/28   4    37  162/102 Right  Left    52   58    -27   *    22   28    25   24    29    *    -22   -24    -35        12/1 GC           Right  Left    46   41    -51   *    -31   45    18   19    26   20     -26   -17    -37                   Right  Left                                                               Right  Left                                                               Right  Left                                                       MCA = Middle Cerebral Artery      OPHT = Opthalmic Artery     BASILAR = Basilar Artery   ACA = Anterior Cerebral Artery     SIPH = Carotid Siphon PCA = Posterior Cerebral Artery   VERT = Verterbral Artery                    Normal MCA = 62+\-12 ACA = 50+\-12 PCA = 42+\-23    Right [place]ard Ratio: 1.35 Left [place] Ratio: 1.46  09/12/24 12:17 PM Cathlyn Collet RVT

## 2024-09-12 NOTE — Plan of Care (Signed)
 Problem: Education: Goal: Knowledge of General Education information will improve Description: Including pain rating scale, medication(s)/side effects and non-pharmacologic comfort measures 09/12/2024 1835 by [name], RN Outcome: Progressing 09/12/2024 0726 by [name], RN Outcome: Progressing   Problem: Health Behavior/Discharge Planning: Goal: Ability to manage health-related needs will improve 09/12/2024 1835 by [name], RN Outcome: Progressing 09/12/2024 0726 by [name], RN Outcome: Progressing   Problem: Clinical Measurements: Goal: Ability to maintain clinical measurements within normal limits will improve 09/12/2024 1835 by [name], RN Outcome: Progressing 09/12/2024 0726 by [name], RN Outcome: Progressing Goal: Will remain free from infection 09/12/2024 1835 by [name], RN Outcome: Progressing 09/12/2024 0726 by [name], RN Outcome: Progressing Goal: Diagnostic test results will improve 09/12/2024 1835 by [name], RN Outcome: Progressing 09/12/2024 0726 by [name], RN Outcome: Progressing Goal: Respiratory complications will improve 09/12/2024 1835 by [name], RN Outcome: Progressing 09/12/2024 0726 by [name], RN Outcome: Progressing Goal: Cardiovascular complication will be avoided 09/12/2024 1835 by [name], RN Outcome: Progressing 09/12/2024 0726 by [name], RN Outcome: Progressing   Problem: Activity: Goal: Risk for activity intolerance will decrease 09/12/2024 1835 by [name], RN Outcome: Progressing 09/12/2024 0726 by [name], RN Outcome: Progressing   Problem: Nutrition: Goal: Adequate nutrition will be maintained 09/12/2024 1835 by [name], RN Outcome: Progressing 09/12/2024 0726 by [name], RN Outcome: Progressing   Problem:  Coping: Goal: Level of anxiety will decrease 09/12/2024 1835 by [name], RN Outcome: Progressing 09/12/2024 0726 by [name], RN Outcome: Progressing   Problem: Elimination: Goal: Will not experience complications related to bowel motility 09/12/2024 1835 by [name], RN Outcome: Progressing 09/12/2024 0726 by [name], RN Outcome: Progressing Goal: Will not experience complications related to urinary retention 09/12/2024 1835 by [name], RN Outcome: Progressing 09/12/2024 0726 by [name], RN Outcome: Progressing   Problem: Pain Managment: Goal: General experience of comfort will improve and/or be controlled 09/12/2024 1835 by [name], RN Outcome: Progressing 09/12/2024 0726 by [name], RN Outcome: Progressing   Problem: Safety: Goal: Ability to remain free from injury will improve 09/12/2024 1835 by [name], RN Outcome: Progressing 09/12/2024 0726 by [name], RN Outcome: Progressing   Problem: Skin Integrity: Goal: Risk for impaired skin integrity will decrease 09/12/2024 1835 by [name], RN Outcome: Progressing 09/12/2024 0726 by [name], RN Outcome: Progressing   Problem: Activity: Goal: Ability to tolerate increased activity will improve 09/12/2024 1835 by [name], RN Outcome: Progressing 09/12/2024 0726 by [name], RN Outcome: Progressing   Problem: Respiratory: Goal: Ability to maintain a clear airway and adequate ventilation will improve 09/12/2024 1835 by [name], RN Outcome: Progressing 09/12/2024 0726 by [name], RN Outcome: Progressing   Problem: Role Relationship: Goal: Method of communication will improve 09/12/2024 1835 by [name], RN Outcome: Progressing 09/12/2024 0726 by [name], RN Outcome: Progressing   Problem:  Education: Goal: Knowledge of disease or condition will improve 09/12/2024 1835 by [name], RN Outcome: Progressing 09/12/2024 0726 by [name], RN Outcome: Progressing Goal: Knowledge of secondary prevention will improve (MUST DOCUMENT ALL) 09/12/2024 1835 by [name], RN Outcome: Progressing 09/12/2024 0726 by [name], RN Outcome: Progressing Goal: Knowledge of patient specific risk factors will improve (  DELETE if not current risk factor) 09/12/2024 1835 by [name], RN Outcome: Progressing 09/12/2024 0726 by [name], RN Outcome: Progressing   Problem: Spontaneous Subarachnoid Hemorrhage Tissue Perfusion: Goal: Complications of Spontaneous Subarachnoid Hemorrhage will be minimized 09/12/2024 1835 by [name], RN Outcome: Progressing 09/12/2024 0726 by [name], RN Outcome: Progressing   Problem: Coping: Goal: Will verbalize positive feelings about self 09/12/2024 1835 by [name], RN Outcome: Progressing 09/12/2024 0726 by [name], RN Outcome: Progressing Goal: Will identify appropriate support needs 09/12/2024 1835 by [name], RN Outcome: Progressing 09/12/2024 0726 by [name], RN Outcome: Progressing   Problem: Health Behavior/Discharge Planning: Goal: Ability to manage health-related needs will improve 09/12/2024 1835 by [name], RN Outcome: Progressing 09/12/2024 0726 by [name], RN Outcome: Progressing Goal: Goals will be collaboratively established with patient/family 09/12/2024 1835 by [name], RN Outcome: Progressing 09/12/2024 0726 by [name], RN Outcome: Progressing   Problem: Self-Care: Goal: Ability to participate in self-care as condition permits will improve 09/12/2024 1835 by [name], RN Outcome: Progressing 09/12/2024 0726 by [name],  RN Outcome: Progressing Goal: Verbalization of feelings and concerns over difficulty with self-care will improve 09/12/2024 1835 by [name], RN Outcome: Progressing 09/12/2024 0726 by [name], RN Outcome: Progressing Goal: Ability to communicate needs accurately will improve 09/12/2024 1835 by [name], RN Outcome: Progressing 09/12/2024 0726 by [name], RN Outcome: Progressing   Problem: Nutrition: Goal: Risk of aspiration will decrease 09/12/2024 1835 by [name], RN Outcome: Progressing 09/12/2024 0726 by [name], RN Outcome: Progressing Goal: Dietary intake will improve 09/12/2024 1835 by [name], RN Outcome: Progressing 09/12/2024 0726 by [name], RN Outcome: Progressing   Problem: Education: Goal: Understanding of CV disease, CV risk reduction, and recovery process will improve 09/12/2024 1835 by [name], RN Outcome: Progressing 09/12/2024 0726 by [name], RN Outcome: Progressing Goal: Individualized Educational Video(s) 09/12/2024 1835 by [name], RN Outcome: Progressing 09/12/2024 0726 by [name], RN Outcome: Progressing   Problem: Activity: Goal: Ability to return to baseline activity level will improve 09/12/2024 1835 by [name], RN Outcome: Progressing 09/12/2024 0726 by [name], RN Outcome: Progressing   Problem: Cardiovascular: Goal: Ability to achieve and maintain adequate cardiovascular perfusion will improve 09/12/2024 1835 by [name], RN Outcome: Progressing 09/12/2024 0726 by [name], RN Outcome: Progressing Goal: Vascular access site(s) Level 0-1 will be maintained 09/12/2024 1835 by [name], RN Outcome: Progressing 09/12/2024 0726 by [name], RN Outcome: Progressing   Problem: Health Behavior/Discharge Planning: Goal: Ability to  safely manage health-related needs after discharge will improve Outcome: Progressing   Problem: Education: Goal: Knowledge of disease or condition will improve Outcome: Progressing Goal: Knowledge of secondary prevention will improve (MUST DOCUMENT ALL) Outcome: Progressing Goal: Knowledge of patient specific risk factors will improve (DELETE if not current risk factor) Outcome: Progressing   Problem: Spontaneous Subarachnoid Hemorrhage Tissue Perfusion: Goal: Complications of Spontaneous Subarachnoid Hemorrhage will be minimized Outcome: Progressing   Problem: Coping: Goal: Will verbalize positive feelings about self Outcome: Progressing Goal: Will identify appropriate support needs Outcome: Progressing   Problem: Health Behavior/Discharge Planning: Goal: Ability to manage health-related needs will improve Outcome: Progressing Goal: Goals will be collaboratively established with patient/family Outcome: Progressing   Problem: Self-Care: Goal: Ability to participate in self-care as  condition permits will improve Outcome: Progressing Goal: Verbalization of feelings and concerns over difficulty with self-care will improve Outcome: Progressing Goal: Ability to communicate needs accurately will improve Outcome: Progressing   Problem: Nutrition: Goal: Risk of aspiration will decrease Outcome: Progressing Goal: Dietary intake will improve Outcome: Progressing

## 2024-09-12 NOTE — Progress Notes (Signed)
 ICP monitoring stopped due to clogged EVD.

## 2024-09-12 NOTE — Progress Notes (Addendum)
 NAME:  Clinton Phillips, MRN:  [id number], DOB:  1961/07/17, LOS: 7 ADMISSION DATE:  09/05/2024, CONSULTATION DATE:  09/05/24 REFERRING MD:  Suzanne - [facility] CHIEF COMPLAINT:  Headache   History of Present Illness:  Clinton Phillips is a 63 y.o. male who has no significant PMH. He presented to [facility] ED 11/24 with headache that started the night prior around midnight before he went to bed.  He didn't wake up for work morning of 11/24 so family checked on him and found him to be altered.  He was taken to [facility] where [facility] showed acute SAH with concern for Acomm rupture and small volume IVH with mild ventriculomegaly c/w hydrocephalus. 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   Underwent EVD placement and aneurysmal coiling. Extubated since then. TCDs reassuring.   Pertinent  Medical History:  has SAH (subarachnoid hemorrhage) (HCC) on their problem list.  Significant Hospital Events: Including procedures, antibiotic start and stop dates in addition to other pertinent events   11/24 admit, underwent EVD placement and aneurysmal coiling 09/06/24 Extubated  09/08/24 low-grade fever 100.2  Interim History / Subjective:  Overnight patient did have clogged EVD.  ICP monitoring was stopped.  MD was notified and recommended clamping EVD.  Evaluated at bedside this morning.  Patient is doing well.  Alert and oriented x 3.  He does report having intermittent headaches and restlessness.  Otherwise no concerns this morning from patient.  Objective:  Blood pressure 138/83, pulse 65, temperature 98.1 F (36.7 C), temperature source Oral, resp. rate 19, SpO2 96%.        Intake/Output Summary (Last 24 hours) at 09/12/2024 9375 Last data filed at 09/12/2024 0400 Gross per 24 hour  Intake --  Output 1039 ml  Net -1039 ml   Afebrile overnight Pulse 65-86 Respirations 14-19 Blood pressure 121/110-160/86 Satting 96% on room air  Physical Exam: General: Adult male resting comfortably in  bed Neuro: Alert and oriented x 3, CN II-XII intact. Finger-nose intact, heel-to-shin intact, pupils equal and reactive to light, sensation intact, 5/5 strength noted to bilateral upper and lower extremities. Proprioception deficit noted to left great toe  HEENT: Normocephalic, atraumatic, EVD in place and clogged  Cardiovascular: Regular rate and rhythm Lungs: Clear to auscultation bilaterally  Abdomen: Soft, nontender, nondistended Musculoskeletal: No edema   Labs/imaging personally reviewed:  CT head 11/24 > acute SAH with concern for Acom rupture and small volume IVH with mild ventriculomegaly c/w hydrocephalus. CTA head 11/24 >  small Acomm aneurysm.  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  Overnight patient did have EVD clogged and ICP monitoring was not able to be done.  No seizures overnight.  Blood pressures are measuring well.  On exam this morning patient is neurovascularly intact with the exception of proprioception deficit to the left great toe. - 3 times weekly TCD's - EVD management per neurosurgery  - Neurochecks  Q4H - Continue Keppra  for 7 days, today is last day of Keppra  for seizure prophylaxis - Post coiling blood pressure parameters to 160 systolic - Continue nimodipine  60 mg every 4 hours - Melatonin 3 mg as needed placed for restlessness at night - Obtain Vitamin B12 level   Pulm #Acute hypoxic respiratory failure in the setting of sedation, resolved Patient has been extubated on 09/06/2024.  Patient is currently satting well on room air.  Patient remains  afebrile.  No acute concerns. - Monitor respiratory status  Cardiac/Vascular  No acute concerns at this time.  GI Patient is eating and drinking well.  No acute concerns at this time.  ID Likely previous fever was on fever.  No acute concerns at this  time for infectious process.  Renal  #Hyponatremia Patient is hyponatremic at 129.  Likely hypotonic hypovolemic hyponatremia.  Encourage oral intake as patient is now extubated and tolerating diet.  Patient does have decreased appetite but tolerating well.  Encourage patient to continue to eat. If worsening, need to think about SIADH -Monitor sodium -Encourage oral intake  -Q6H sodium checks   Endo No acute concerns at this time.  Heme/Onc DVT ppx : SCD, heparin    MSK/other  #No PCP Patient does not have a PCP, recommended patient to get a PCP once patient gets out of the hospital. - TOC consult placed for PCP needs   Dispo: EVD in place, once clinically appropriate, will send to floor  Labs   CBC: Recent Labs  Lab 09/06/24 0456 09/07/24 0216 09/08/24 0214 09/09/24 0305 09/10/24 0347  WBC 7.3 8.7 5.9 5.9 6.5  HGB 11.6* 11.2* 11.9* 12.8* 13.2  HCT 35.9* 33.0* 35.4* 37.2* 37.2*  MCV 99.4 97.1 95.2 94.4 93.2  PLT 251 205 204 197 201    Basic Metabolic Panel: Recent Labs  Lab 09/06/24 0456 09/06/24 1938 09/07/24 0216 09/07/24 0739 09/08/24 0214 09/09/24 0305 09/10/24 0347 09/11/24 0330 09/12/24 0338  NA 137   < >  --    < > 136 136 132* 128* 129*  K 5.5*   < >  --    < > 3.9 3.9 4.2 4.3 4.0  CL 108   < >  --    < > 102 101 98 96* 97*  CO2 20*   < >  --    < > 23 24 22 22  19*  GLUCOSE 156*   < >  --    < > 101* 135* 118* 109* 156*  BUN 17   < >  --    < > 18 16 18 22 21   CREATININE 1.25*   < >  --    < > 1.16 1.09 1.12 1.21 1.19  CALCIUM 8.3*   < >  --    < > 8.7* 8.7* 9.0 8.7* 8.9  MG 2.0  --  1.9  --  2.0 2.1 2.1  --   --   PHOS 3.6  --  3.3  --  3.9 4.0 3.9  --   --    < > = values in this interval not displayed.   GFR: Estimated Creatinine Clearance: 79.6 mL/min (by C-G formula based on SCr of 1.19 mg/dL). Recent Labs  Lab 09/05/24 1101 09/06/24 0456 09/07/24 0216 09/08/24 0214 09/09/24 0305 09/10/24 0347  WBC 7.1   < > 8.7 5.9 5.9 6.5   LATICACIDVEN 2.2*  --   --   --   --   --    < > = values in this interval not displayed.    Liver Function Tests: No results for input(s): AST, ALT, ALKPHOS, BILITOT, PROT, ALBUMIN in the last 168 hours. No results for input(s): LIPASE, AMYLASE in the last 168 hours. No results for input(s): AMMONIA in the last 168 hours.  ABG    Component Value Date/Time   PHART 7.396 09/05/2024 1834   PCO2ART 40.9 09/05/2024 1834   PO2ART 288 (H) 09/05/2024 1834   HCO3  25.0 09/05/2024 1834   TCO2 26 09/05/2024 1834   ACIDBASEDEF 3.0 (H) 09/05/2024 1739   O2SAT 100 09/05/2024 1834     Coagulation Profile: Recent Labs  Lab 09/05/24 1101  INR 1.0    Cardiac Enzymes: No results for input(s): CKTOTAL, CKMB, CKMBINDEX, TROPONINI in the last 168 hours.  HbA1C: No results found for: HGBA1C  CBG: Recent Labs  Lab 09/07/24 1509 09/07/24 1912 09/07/24 2318 09/08/24 0310 09/08/24 0748  GLUCAP 124* 157* 94 101* 129*    Review of Systems:   Unable to obtain as pt is encephalopathic.  Past Medical History:  He,  has a past medical history of Anemia.   Surgical History:   Past Surgical History:  Procedure Laterality Date   IR 3D INDEPENDENT WKST  09/05/2024   IR ANGIO INTRA EXTRACRAN SEL COM CAROTID INNOMINATE UNI L MOD SED  09/05/2024   IR ANGIOGRAM FOLLOW UP STUDY  09/05/2024   IR ANGIOGRAM FOLLOW UP STUDY  09/05/2024   IR ANGIOGRAM FOLLOW UP STUDY  09/05/2024   IR CT HEAD LTD  09/05/2024   IR TRANSCATH/EMBOLIZ  09/05/2024   IR US  GUIDE VASC ACCESS RIGHT  09/05/2024   RADIOLOGY WITH ANESTHESIA N/A 09/05/2024   Procedure: RADIOLOGY WITH ANESTHESIA;  Surgeon: Radiologist, Medication, MD;  Location: [facility] OR;  Service: Radiology;  Laterality: N/A;     Social History:   reports that he has been smoking cigarettes. He has a 23 pack-year smoking history. He has never used smokeless tobacco. He reports current alcohol use of about 12.0 standard drinks of  alcohol per week. He reports current drug use. Drug: Marijuana.   Family History:  His family history is not on file.   Allergies No Known Allergies   Home Medications  Prior to Admission medications   Medication Sig Start Date End Date Taking? Authorizing Provider  cyclobenzaprine  (FLEXERIL ) 5 MG tablet Take 1-2 tablets (5-10 mg total) by mouth 3 (three) times daily as needed for muscle spasms. Patient not taking: Reported on 09/05/2024 03/18/23   Charlene Debby BROCKS, PA-C  cyclobenzaprine  (FLEXERIL ) 5 MG tablet Take 1-2 tablets (5-10 mg total) by mouth 3 (three) times daily as needed for muscle spasms. Patient not taking: Reported on 09/05/2024 03/18/23   Gaines, Thomas C, PA-C  HYDROcodone -acetaminophen  (NORCO) 5-325 MG tablet Take 1 tablet by mouth every 6 (six) hours as needed for moderate pain. Patient not taking: Reported on 09/05/2024 03/18/23   Charlene Debby BROCKS, PA-C  ondansetron  (ZOFRAN  ODT) 4 MG disintegrating tablet Take 1 tablet (4 mg total) by mouth every 8 (eight) hours as needed for nausea or vomiting. Patient not taking: Reported on 09/05/2024 01/05/21   Angelena Smalls, MD  predniSONE  (DELTASONE ) 10 MG tablet Take 1 tablet (10 mg total) by mouth daily. 6,5,4,3,2,1 six day taper Patient not taking: Reported on 09/05/2024 03/18/23   Charlene Debby BROCKS, PA-C     Zia Najera, DO Internal Medicine Resident PGY-3

## 2024-09-13 ENCOUNTER — Inpatient Hospital Stay (HOSPITAL_COMMUNITY)

## 2024-09-13 ENCOUNTER — Other Ambulatory Visit: Payer: Self-pay

## 2024-09-13 DIAGNOSIS — I609 Nontraumatic subarachnoid hemorrhage, unspecified: Secondary | ICD-10-CM

## 2024-09-13 LAB — BASIC METABOLIC PANEL WITH GFR
Anion gap: 9 (ref 5–15)
BUN: 16 mg/dL (ref 8–23)
CO2: 24 mmol/L (ref 22–32)
Calcium: 9 mg/dL (ref 8.9–10.3)
Chloride: 100 mmol/L (ref 98–111)
Creatinine, Ser: 1.15 mg/dL (ref 0.61–1.24)
GFR, Estimated: >60 mL/min (ref >60)
Glucose, Bld: 125 mg/dL — ABNORMAL HIGH (ref 70–99)
Potassium: 4.3 mmol/L (ref 3.5–5.1)
Sodium: 133 mmol/L — ABNORMAL LOW (ref 135–145)

## 2024-09-13 LAB — CBC WITH DIFFERENTIAL/PLATELET
Abs Immature Granulocytes: 0.05 K/uL (ref 0.00–0.07)
Basophils Absolute: 0 K/uL (ref 0.0–0.1)
Basophils Relative: 0 %
Eosinophils Absolute: 0 K/uL (ref 0.0–0.5)
Eosinophils Relative: 0 %
HCT: 36.9 % — ABNORMAL LOW (ref 39.0–52.0)
Hemoglobin: 13 g/dL (ref 13.0–17.0)
Immature Granulocytes: 1 %
Lymphocytes Relative: 17 %
Lymphs Abs: 1.3 K/uL (ref 0.7–4.0)
MCH: 32.6 pg (ref 26.0–34.0)
MCHC: 35.2 g/dL (ref 30.0–36.0)
MCV: 92.5 fL (ref 80.0–100.0)
Monocytes Absolute: 0.8 K/uL (ref 0.1–1.0)
Monocytes Relative: 10 %
Neutro Abs: 5.5 K/uL (ref 1.7–7.7)
Neutrophils Relative %: 72 %
Platelets: 239 K/uL (ref 150–400)
RBC: 3.99 MIL/uL — ABNORMAL LOW (ref 4.22–5.81)
RDW: 11.8 % (ref 11.5–15.5)
WBC: 7.8 K/uL (ref 4.0–10.5)
nRBC: 0 % (ref 0.0–0.2)

## 2024-09-13 LAB — SODIUM
Sodium: 131 mmol/L — ABNORMAL LOW (ref 135–145)
Sodium: 131 mmol/L — ABNORMAL LOW (ref 135–145)
Sodium: 133 mmol/L — ABNORMAL LOW (ref 135–145)

## 2024-09-13 LAB — MAGNESIUM: Magnesium: 2.5 mg/dL — ABNORMAL HIGH (ref 1.7–2.4)

## 2024-09-13 LAB — VITAMIN B12: Vitamin B-12: 305 pg/mL (ref 180–914)

## 2024-09-13 LAB — PHOSPHORUS: Phosphorus: 4 mg/dL (ref 2.5–4.6)

## 2024-09-13 MED ORDER — SODIUM CHLORIDE 3 % IV SOLN
INTRAVENOUS | Status: DC
  Filled 2024-09-13: qty 500

## 2024-09-13 MED ORDER — OXYCODONE HCL 5 MG PO TABS
5.0000 mg | ORAL_TABLET | Freq: Four times a day (QID) | ORAL | Status: DC | PRN
  Administered 2024-09-13 – 2024-09-18 (×9): 5 mg via ORAL
  Filled 2024-09-13 (×9): qty 1

## 2024-09-13 MED ORDER — SODIUM CHLORIDE 3 % IV SOLN: INTRAVENOUS | Status: DC

## 2024-09-13 MED ORDER — SODIUM CHLORIDE 3 % IV SOLN
INTRAVENOUS | Status: DC
  Filled 2024-09-13 (×7): qty 500

## 2024-09-13 MED ORDER — DICLOFENAC SODIUM 1 % EX GEL
4.0000 g | Freq: Four times a day (QID) | CUTANEOUS | Status: DC
  Administered 2024-09-13 – 2024-09-19 (×20): 4 g via TOPICAL
  Filled 2024-09-13 (×2): qty 100

## 2024-09-13 MED ORDER — SODIUM CHLORIDE 0.9% FLUSH
10.0000 mL | Freq: Two times a day (BID) | INTRAVENOUS | Status: DC
  Administered 2024-09-13 – 2024-09-18 (×10): 10 mL
  Administered 2024-09-19: 30 mL

## 2024-09-13 MED ORDER — SODIUM CHLORIDE 0.9% FLUSH: 10.0000 mL | INTRAVENOUS | Status: DC | PRN

## 2024-09-13 NOTE — Progress Notes (Signed)
 Transcranial Doppler   Date POD PCO2 HCT BP   MCA ACA PCA OPHT SIPH VERT Basilar  11/25 JH 1   35.9 96/68 Right  Left   69  44   *  -22   16  18   15  11    *  *   -14  -23   -38       11/26 JH  2    33 121/78  Right  Left    70   53    *   -22    21   23    17   16     *   *    -24   -23    -37       11/28   4    37  162/102 Right  Left    52   58    -27   *    22   28    25   24    29    *    -22   -24    -35        12/1 GC           Right  Left    46   41    -51   *    -31   45    18   19    26   20     -26   -17    -37        12/2 JH   8   36.9  175/90  Right  Left    58   66    -17   *    21   26    22   22     *   *    -26   -35    -40                 Right  Left                                                               Right  Left                                                       MCA = Middle Cerebral Artery      OPHT = Opthalmic Artery     BASILAR = Basilar Artery   ACA = Anterior Cerebral Artery     SIPH = Carotid Siphon PCA = Posterior Cerebral Artery   VERT = Verterbral Artery                    Normal MCA = 62+\-12 ACA = 50+\-12 PCA = 42+\-23    Right Lindegaard Ratio: 2.32 Left Lindegaard Ratio: 2.87  Results can be found under chart review under CV PROC. 09/13/2024 3:34 PM Ramonte Mena RVT, RDMS

## 2024-09-13 NOTE — Progress Notes (Signed)
 Peripherally Inserted Central Catheter Placement  The IV Nurse has discussed with the patient and/or persons authorized to consent for the patient, the purpose of this procedure and the potential benefits and risks involved with this procedure.  The benefits include less needle sticks, lab draws from the catheter, and the patient may be discharged home with the catheter. Risks include, but not limited to, infection, bleeding, blood clot (thrombus formation), and puncture of an artery; nerve damage and irregular heartbeat and possibility to perform a PICC exchange if needed/ordered by physician.  Alternatives to this procedure were also discussed.  Bard Power PICC patient education guide, fact sheet on infection prevention and patient information card has been provided to patient /or left at bedside.    PICC Placement Documentation  PICC Double Lumen 09/13/24 Right Brachial 40 cm 0 cm (Active)  Indication for Insertion or Continuance of Line Administration of hyperosmolar/irritating solutions (i.e. TPN, Vancomycin, etc.) 09/13/24 1704  Exposed Catheter (cm) 0 cm 09/13/24 1704  Site Assessment Clean, Dry, Intact 09/13/24 1704  Lumen #1 Status Flushed;Saline locked;Blood return noted 09/13/24 1704  Lumen #2 Status Flushed;Saline locked;Blood return noted 09/13/24 1704  Dressing Type Transparent;Securing device 09/13/24 1704  Dressing Status Antimicrobial disc/dressing in place;Clean, Dry, Intact 09/13/24 1704  Line Care Connections checked and tightened 09/13/24 1704  Line Adjustment (NICU/IV Team Only) No 09/13/24 1704  Dressing Intervention New dressing;Adhesive placed at insertion site (IV team only) 09/13/24 1704  Dressing Change Due 09/20/24 09/13/24 1704    Telephone consent signed by daughter   Renaee Neville Skillern 09/13/2024, 5:07 PM

## 2024-09-13 NOTE — Progress Notes (Signed)
 Physical Therapy Treatment Patient Details Name: Clinton Phillips MRN: [id number] DOB: 12-19-1960 Today's Date: 09/13/2024   History of Present Illness 63 yo male presents to [facility] from [facility] on 11/24 with headache, AMS. CTH showed acute SAH with concern for Acomm rupture and small volume IVH with mild ventriculomegaly c/w hydrocephalus. EVD 11/24-12/1. ETT 11/24-11/25. PMHx: none.    PT Comments  Pt progressing towards all goals however continues to demo both cognitive and functional deficits. Pt continues with decreased insight to deficits and safety, impaired balance, ataxic gait pattern, posterior bias, impaired coordination, and modA for transfers and ADLs. Pt remains to be an excellent candidate for aggressive inpatient rehab program > 3 hrs a day to address above deficits. Acute PT to cont to follow.    If plan is discharge home, recommend the following: A lot of help with walking and/or transfers;A lot of help with bathing/dressing/bathroom   Can travel by private vehicle        Equipment Recommendations  None recommended by PT    Recommendations for Other Services       Precautions / Restrictions Precautions Precautions: Fall Recall of Precautions/Restrictions: Impaired Precaution/Restrictions Comments: EVD d/c 12/1 Restrictions Weight Bearing Restrictions Per Provider Order: No     Mobility  Bed Mobility Overal bed mobility: Needs Assistance Bed Mobility: Supine to Sit     Supine to sit: Mod assist     General bed mobility comments: max directional verbal and tactile cues for log roll to sidelying to sit up EOB, modA for trunk elevation, increased time    Transfers Overall transfer level: Needs assistance Equipment used: 2 person hand held assist Transfers: Sit to/from Stand Sit to Stand: Min assist, +2 physical assistance, +2 safety/equipment           General transfer comment: min A +2 for initial STS, pt with posterior bias/back extension past  neutral hips    Ambulation/Gait Ambulation/Gait assistance: Mod assist, +2 physical assistance Gait Distance (Feet): 120 Feet Assistive device: 2 person hand held assist Gait Pattern/deviations: Step-through pattern, Decreased stride length, Trunk flexed, Leaning posteriorly, Ataxic, Scissoring Gait velocity: decr Gait velocity interpretation: <1.31 ft/sec, indicative of household ambulator   General Gait Details: heavy posterior bias and periods of L lateral leaning, ataxic gait with wide base of support and lateral sway L/R. Pt easily distracting stopping frequently. Pt with steppage bilat LE however denies impaired sensation. max directional cues to navigate hallways and return to room   Stairs             Wheelchair Mobility     Tilt Bed    Modified Rankin (Stroke Patients Only)       Balance Overall balance assessment: Needs assistance Sitting-balance support: Feet supported Sitting balance-Leahy Scale: Good     Standing balance support: Bilateral upper extremity supported, During functional activity Standing balance-Leahy Scale: Poor                              Communication Communication Communication: No apparent difficulties Factors Affecting Communication: Hearing impaired  Cognition Arousal: Alert Behavior During Therapy: Restless, Impulsive   PT - Cognitive impairments: Awareness, Memory, Attention, Sequencing, Problem solving, Safety/Judgement                       PT - Cognition Comments: restless, poor insight into deficits. uses humor to cover difficulties with cognition at times, requires constant cues to sequence, poor recall/short  term memory Following commands: Impaired Following commands impaired: Follows one step commands with increased time    Cueing Cueing Techniques: Verbal cues, Gestural cues  Exercises      General Comments General comments (skin integrity, edema, etc.): pt assisted to commode, required step  by step verbal cues to sequence sitting on commode, +BM, dependent for hygiene      Pertinent Vitals/Pain Pain Assessment Pain Assessment: Faces Faces Pain Scale: Hurts even more Pain Location: low back Pain Descriptors / Indicators: Discomfort, Grimacing, Guarding, Cramping    Home Living                          Prior Function            PT Goals (current goals can now be found in the care plan section) Acute Rehab PT Goals Patient Stated Goal: home PT Goal Formulation: With patient Time For Goal Achievement: 09/20/24 Potential to Achieve Goals: Good Progress towards PT goals: Progressing toward goals    Frequency    Min 3X/week      PT Plan      Co-evaluation              AM-PAC PT 6 Clicks Mobility   Outcome Measure  Help needed turning from your back to your side while in a flat bed without using bedrails?: A Little Help needed moving from lying on your back to sitting on the side of a flat bed without using bedrails?: A Little Help needed moving to and from a bed to a chair (including a wheelchair)?: A Little Help needed standing up from a chair using your arms (e.g., wheelchair or bedside chair)?: A Little Help needed to walk in hospital room?: A Lot Help needed climbing 3-5 steps with a railing? : Total 6 Click Score: 15    End of Session Equipment Utilized During Treatment: Gait belt Activity Tolerance: Patient limited by fatigue Patient left: in chair;with call bell/phone within reach;with chair alarm set;with family/visitor present (posey alarm belt) Nurse Communication: Mobility status PT Visit Diagnosis: Other abnormalities of gait and mobility (R26.89);Muscle weakness (generalized) (M62.81)     Time: 8963-8895 PT Time Calculation (min) (ACUTE ONLY): 28 min  Charges:    $Gait Training: 8-22 mins $Therapeutic Activity: 8-22 mins PT General Charges $$ ACUTE PT VISIT: 1 Visit                     Norene Ames, PT,  DPT Acute Rehabilitation Services Secure chat preferred Office #: [phone]    Norene CHRISTELLA Ames 09/13/2024, 12:54 PM

## 2024-09-13 NOTE — Progress Notes (Signed)
 NAME:  Clinton Phillips, MRN:  [id number], DOB:  [birth year], LOS: 8 ADMISSION DATE:  09/05/2024, CONSULTATION DATE:  09/05/24 REFERRING MD:  Suzanne - [facility] CHIEF COMPLAINT:  Headache   History of Present Illness:  Clinton Phillips is a 63 y.o. male who has no significant PMH. He presented to [facility] ED 11/24 with headache that started the night prior around midnight before he went to bed.  He didn't wake up for work morning of 11/24 so family checked on him and found him to be altered.  He was taken to [facility] where [facility] showed acute SAH with concern for Acomm rupture and small volume IVH with mild ventriculomegaly c/w hydrocephalus. 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   Underwent EVD placement and aneurysmal coiling. Extubated since then. TCDs reassuring.   Pertinent  Medical History:  has SAH (subarachnoid hemorrhage) (HCC); Obstructive hydrocephalus (HCC); and Hyponatremia on their problem list.  Significant Hospital Events: Including procedures, antibiotic start and stop dates in addition to other pertinent events   11/24 admit, underwent EVD placement and aneurysmal coiling 09/06/24 Extubated  09/08/24 low-grade fever 100.2 09/13/24 EVD removed   Interim History / Subjective:  Overnight patient did have fever to 101.1 F.  Patient evaluated bedside this morning.  He states he is doing well.  He has no concerns.  His headache has improved.  He denies any sensation loss or any motor loss.  Objective:  Blood pressure (!) 159/79, pulse 75, temperature 98 F (36.7 C), temperature source Oral, resp. rate (!) 21, SpO2 93%.        Intake/Output Summary (Last 24 hours) at 09/13/2024 9375 Last data filed at 09/13/2024 0400 Gross per 24 hour  Intake 1379.21 ml  Output 1480 ml  Net -100.79 ml   Febrile at 101.1 F yesterday at 8 PM. Blood pressure: 140s-160s systolics Heart rate 60-80s Satting at 95% on room air  Physical Exam: General: Adult male resting comfortably  in bed Neuro: Alert and oriented x 2 (not oriented to year), CN II-XII intact. Pupils equal and reactive to light, sensation intact, 5/5 strength noted to bilateral upper and lower extremities.  HEENT: Normocephalic, stitches healing well to right scalp Cardiovascular: Regular rate and rhythm Lungs: Clear to auscultation bilaterally  Abdomen: Soft, nontender, nondistended Musculoskeletal: No edema   Labs/imaging personally reviewed:  CT head 11/24 > acute SAH with concern for Acom rupture and small volume IVH with mild ventriculomegaly c/w hydrocephalus. CTA head 11/24 >  small Acomm aneurysm.  CT head 12/2: IMPRESSION: 1. Stable hydrocephalus and unchanged intraventricular hemorrhage in the bilateral occipital horns. 2. Unchanged subarachnoid hemorrhage along the falx and bilateral convexities. 3. Small amount of intraparenchymal hemorrhage along the catheter tract. 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 patient does have small amount of intraparenchymal hemorrhage along catheter tract.  Neurosurgery following.  Overnight no acute events.  Sodium is slowly trending back up.  Did obtain vitamin B12 levels which were low normal at 305.  TCD read normal, but does have some indices showing intracranial pressure present still.  Keppra  is now finished.  On exam this morning patient does not know what year it is, but otherwise has no new neurological deficits. 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  Volume restriction has been helping.  Did start sodium chloride  tablets yesterday.  Sodium up to 133 this morning.  Will continue to monitor closely. - Monitor sodium every 6 hours - Continue sodium chloride  1 g 3 times daily with meals  #Fever Patient did have fever last night.  However I do not think patient has any infectious symptoms at this time.  Will monitor clinically for any signs of infection. -Tylenol  as needed for fevers - If continues to fever, recommend obtaining blood cultures  #No PCP Patient does not have a PCP, recommended patient to get a PCP once patient gets out of the hospital. - TOC consult placed for PCP needs   DVT ppx : heparin    Labs   CBC: Recent Labs  Lab 09/07/24 0216 09/08/24 0214 09/09/24 0305 09/10/24 0347 09/13/24 0222  WBC 8.7 5.9 5.9 6.5 7.8  NEUTROABS  --   --   --   --  5.5  HGB 11.2* 11.9* 12.8* 13.2 13.0  HCT 33.0* 35.4* 37.2* 37.2* 36.9*  MCV 97.1 95.2 94.4 93.2 92.5  PLT 205 204 197 201 239    Basic Metabolic Panel: Recent Labs  Lab 09/07/24 0216 09/07/24 0739 09/08/24 0214 09/09/24 0305 09/10/24 0347 09/11/24 0330 09/12/24 0338 09/12/24 1023 09/12/24 1402 09/12/24 1947 09/13/24 0222  NA  --    < > 136 136 132* 128* 129* 133* 132* 133* 133*  K  --    < > 3.9 3.9 4.2 4.3 4.0  --   --   --  4.3  CL  --    < > 102 101 98 96* 97*  --   --   --  100  CO2  --    < > 23 24 22 22  19*  --   --   --  24  GLUCOSE  --    < > 101* 135* 118* 109* 156*  --   --   --  125*  BUN  --    < > 18 16 18 22 21   --   --   --  16  CREATININE  --    < > 1.16 1.09 1.12 1.21 1.19  --   --   --  1.15  CALCIUM  --    < > 8.7* 8.7* 9.0 8.7* 8.9  --   --   --  9.0  MG 1.9  --  2.0 2.1 2.1  --   --   --   --   --  2.5*  PHOS 3.3  --  3.9 4.0 3.9  --   --   --   --   --  4.0   < > = values in this interval not displayed.   GFR: Estimated Creatinine Clearance:  82.3 mL/min (by C-G formula based on SCr of 1.15 mg/dL). Recent Labs  Lab 09/08/24 0214 09/09/24 0305 09/10/24 0347 09/13/24 0222  WBC 5.9 5.9 6.5 7.8    Liver Function Tests: No results for input(s): AST, ALT, ALKPHOS, BILITOT, PROT, ALBUMIN in the last 168 hours. No results for input(s): LIPASE, AMYLASE in the last 168 hours. No results for input(s): AMMONIA in the last 168 hours.  ABG  Component Value Date/Time   PHART 7.396 09/05/2024 1834   PCO2ART 40.9 09/05/2024 1834   PO2ART 288 (H) 09/05/2024 1834   HCO3 25.0 09/05/2024 1834   TCO2 26 09/05/2024 1834   ACIDBASEDEF 3.0 (H) 09/05/2024 1739   O2SAT 100 09/05/2024 1834     Coagulation Profile: No results for input(s): INR, PROTIME in the last 168 hours.   Cardiac Enzymes: No results for input(s): CKTOTAL, CKMB, CKMBINDEX, TROPONINI in the last 168 hours.  HbA1C: No results found for: HGBA1C  CBG: Recent Labs  Lab 09/07/24 1912 09/07/24 2318 09/08/24 0310 09/08/24 0748 09/12/24 2308  GLUCAP 157* 94 101* 129* 136*    Review of Systems:   Unable to obtain as pt is encephalopathic.  Past Medical History:  He,  has a past medical history of Anemia.   Surgical History:   Past Surgical History:  Procedure Laterality Date   IR 3D INDEPENDENT WKST  09/05/2024   IR ANGIO INTRA EXTRACRAN SEL COM CAROTID INNOMINATE UNI L MOD SED  09/05/2024   IR ANGIOGRAM FOLLOW UP STUDY  09/05/2024   IR ANGIOGRAM FOLLOW UP STUDY  09/05/2024   IR ANGIOGRAM FOLLOW UP STUDY  09/05/2024   IR CT HEAD LTD  09/05/2024   IR TRANSCATH/EMBOLIZ  09/05/2024   IR US  GUIDE VASC ACCESS RIGHT  09/05/2024   RADIOLOGY WITH ANESTHESIA N/A 09/05/2024   Procedure: RADIOLOGY WITH ANESTHESIA;  Surgeon: Radiologist, Medication, MD;  Location: [facility] OR;  Service: Radiology;  Laterality: N/A;     Social History:   reports that he has been smoking cigarettes. He has a 23 pack-year smoking history. He has never used  smokeless tobacco. He reports current alcohol use of about 12.0 standard drinks of alcohol per week. He reports current drug use. Drug: Marijuana.   Family History:  His family history is not on file.   Allergies No Known Allergies   Home Medications  Prior to Admission medications   Medication Sig Start Date End Date Taking? Authorizing Provider  cyclobenzaprine  (FLEXERIL ) 5 MG tablet Take 1-2 tablets (5-10 mg total) by mouth 3 (three) times daily as needed for muscle spasms. Patient not taking: Reported on 09/05/2024 03/18/23   Charlene Debby BROCKS, PA-C  cyclobenzaprine  (FLEXERIL ) 5 MG tablet Take 1-2 tablets (5-10 mg total) by mouth 3 (three) times daily as needed for muscle spasms. Patient not taking: Reported on 09/05/2024 03/18/23   Gaines, Thomas C, PA-C  HYDROcodone -acetaminophen  (NORCO) 5-325 MG tablet Take 1 tablet by mouth every 6 (six) hours as needed for moderate pain. Patient not taking: Reported on 09/05/2024 03/18/23   Charlene Debby BROCKS, PA-C  ondansetron  (ZOFRAN  ODT) 4 MG disintegrating tablet Take 1 tablet (4 mg total) by mouth every 8 (eight) hours as needed for nausea or vomiting. Patient not taking: Reported on 09/05/2024 01/05/21   Angelena Smalls, MD  predniSONE  (DELTASONE ) 10 MG tablet Take 1 tablet (10 mg total) by mouth daily. 6,5,4,3,2,1 six day taper Patient not taking: Reported on 09/05/2024 03/18/23   Charlene Debby BROCKS, PA-C     Bryton Waight, DO Internal Medicine Resident PGY-3

## 2024-09-13 NOTE — Plan of Care (Signed)
 Problem: Education: Goal: Knowledge of General Education information will improve Description: Including pain rating scale, medication(s)/side effects and non-pharmacologic comfort measures Outcome: Progressing   Problem: Health Behavior/Discharge Planning: Goal: Ability to manage health-related needs will improve Outcome: Progressing   Problem: Clinical Measurements: Goal: Ability to maintain clinical measurements within normal limits will improve Outcome: Progressing Goal: Will remain free from infection Outcome: Progressing Goal: Diagnostic test results will improve Outcome: Progressing Goal: Respiratory complications will improve Outcome: Progressing Goal: Cardiovascular complication will be avoided Outcome: Progressing   Problem: Activity: Goal: Risk for activity intolerance will decrease Outcome: Progressing   Problem: Nutrition: Goal: Adequate nutrition will be maintained Outcome: Progressing   Problem: Coping: Goal: Level of anxiety will decrease Outcome: Progressing   Problem: Elimination: Goal: Will not experience complications related to bowel motility Outcome: Progressing Goal: Will not experience complications related to urinary retention Outcome: Progressing   Problem: Pain Managment: Goal: General experience of comfort will improve and/or be controlled Outcome: Progressing   Problem: Safety: Goal: Ability to remain free from injury will improve Outcome: Progressing   Problem: Skin Integrity: Goal: Risk for impaired skin integrity will decrease Outcome: Progressing   Problem: Activity: Goal: Ability to tolerate increased activity will improve Outcome: Progressing   Problem: Respiratory: Goal: Ability to maintain a clear airway and adequate ventilation will improve Outcome: Progressing   Problem: Role Relationship: Goal: Method of communication will improve Outcome: Progressing   Problem: Education: Goal: Knowledge of disease or condition  will improve Outcome: Progressing Goal: Knowledge of secondary prevention will improve (MUST DOCUMENT ALL) Outcome: Progressing Goal: Knowledge of patient specific risk factors will improve (DELETE if not current risk factor) Outcome: Progressing   Problem: Spontaneous Subarachnoid Hemorrhage Tissue Perfusion: Goal: Complications of Spontaneous Subarachnoid Hemorrhage will be minimized Outcome: Progressing   Problem: Coping: Goal: Will verbalize positive feelings about self Outcome: Progressing Goal: Will identify appropriate support needs Outcome: Progressing   Problem: Health Behavior/Discharge Planning: Goal: Ability to manage health-related needs will improve Outcome: Progressing Goal: Goals will be collaboratively established with patient/family Outcome: Progressing   Problem: Self-Care: Goal: Ability to participate in self-care as condition permits will improve Outcome: Progressing Goal: Verbalization of feelings and concerns over difficulty with self-care will improve Outcome: Progressing Goal: Ability to communicate needs accurately will improve Outcome: Progressing   Problem: Nutrition: Goal: Risk of aspiration will decrease Outcome: Progressing Goal: Dietary intake will improve Outcome: Progressing   Problem: Education: Goal: Understanding of CV disease, CV risk reduction, and recovery process will improve Outcome: Progressing Goal: Individualized Educational Video(s) Outcome: Progressing   Problem: Activity: Goal: Ability to return to baseline activity level will improve Outcome: Progressing   Problem: Cardiovascular: Goal: Ability to achieve and maintain adequate cardiovascular perfusion will improve Outcome: Progressing Goal: Vascular access site(s) Level 0-1 will be maintained Outcome: Progressing   Problem: Education: Goal: Knowledge of disease or condition will improve Outcome: Progressing Goal: Knowledge of secondary prevention will improve  (MUST DOCUMENT ALL) Outcome: Progressing Goal: Knowledge of patient specific risk factors will improve (DELETE if not current risk factor) Outcome: Progressing   Problem: Spontaneous Subarachnoid Hemorrhage Tissue Perfusion: Goal: Complications of Spontaneous Subarachnoid Hemorrhage will be minimized Outcome: Progressing   Problem: Coping: Goal: Will verbalize positive feelings about self Outcome: Progressing Goal: Will identify appropriate support needs Outcome: Progressing   Problem: Health Behavior/Discharge Planning: Goal: Ability to manage health-related needs will improve Outcome: Progressing Goal: Goals will be collaboratively established with patient/family Outcome: Progressing   Problem: Self-Care: Goal: Ability to participate  in self-care as condition permits will improve Outcome: Progressing Goal: Verbalization of feelings and concerns over difficulty with self-care will improve Outcome: Progressing Goal: Ability to communicate needs accurately will improve Outcome: Progressing   Problem: Nutrition: Goal: Risk of aspiration will decrease Outcome: Progressing Goal: Dietary intake will improve Outcome: Progressing

## 2024-09-13 NOTE — Progress Notes (Signed)
 Inpatient Rehab Admissions Coordinator:    CIR following. Pt. Still with EVD, not yet ready for CIR admit.  Leita Kleine, MS, CCC-SLP Rehab Admissions Coordinator  [phone] (celll) [phone] (office)

## 2024-09-13 NOTE — Plan of Care (Addendum)
 [name], DO rounded on pt.  See provider note.  1215:  PT rounded on pt.  PT note.  1330: [name], DO rounded on pt.  See provider note.  1435:  Notified [name], DO pts NA resulted at 131.  Verbal order received to titrate 3% Na to 75 mL/hr temporarily.  Order placed by provider for PICC line placement.   Problem: Education: Goal: Knowledge of General Education information will improve Description: Including pain rating scale, medication(s)/side effects and non-pharmacologic comfort measures 09/13/2024 0742 by [name], RN Outcome: Not Progressing 09/12/2024 1835 by [name], RN Outcome: Progressing   Problem: Health Behavior/Discharge Planning: Goal: Ability to manage health-related needs will improve 09/13/2024 0742 by [name], RN Outcome: Not Progressing 09/12/2024 1835 by [name], RN Outcome: Progressing   Problem: Clinical Measurements: Goal: Ability to maintain clinical measurements within normal limits will improve 09/13/2024 0742 by [name], RN Outcome: Not Progressing 09/12/2024 1835 by [name], RN Outcome: Progressing Goal: Will remain free from infection 09/13/2024 0742 by [name], RN Outcome: Not Progressing 09/12/2024 1835 by [name], RN Outcome: Progressing Goal: Diagnostic test results will improve 09/13/2024 0742 by [name], RN Outcome: Not Progressing 09/12/2024 1835 by [name], RN Outcome: Progressing Goal: Respiratory complications will improve 09/13/2024 0742 by [name], RN Outcome: Not Progressing 09/12/2024 1835 by [name], RN Outcome: Progressing Goal: Cardiovascular complication will be avoided 09/13/2024 0742 by [name], RN Outcome: Not Progressing 09/12/2024 1835 by [name], RN Outcome: Progressing   Problem: Activity: Goal: Risk for activity intolerance will decrease 09/13/2024  0742 by [name], RN Outcome: Not Progressing 09/12/2024 1835 by [name], RN Outcome: Progressing   Problem: Nutrition: Goal: Adequate nutrition will be maintained 09/13/2024 0742 by [name], RN Outcome: Not Progressing 09/12/2024 1835 by [name], RN Outcome: Progressing   Problem: Coping: Goal: Level of anxiety will decrease 09/13/2024 0742 by [name], RN Outcome: Not Progressing 09/12/2024 1835 by [name], RN Outcome: Progressing   Problem: Elimination: Goal: Will not experience complications related to bowel motility 09/13/2024 0742 by [name], RN Outcome: Not Progressing 09/12/2024 1835 by [name], RN Outcome: Progressing Goal: Will not experience complications related to urinary retention 09/13/2024 0742 by [name], RN Outcome: Not Progressing 09/12/2024 1835 by [name], RN Outcome: Progressing   Problem: Pain Managment: Goal: General experience of comfort will improve and/or be controlled 09/13/2024 0742 by [name], RN Outcome: Not Progressing 09/12/2024 1835 by [name], RN Outcome: Progressing   Problem: Safety: Goal: Ability to remain free from injury will improve 09/13/2024 0742 by [name], RN Outcome: Not Progressing 09/12/2024 1835 by [name], RN Outcome: Progressing   Problem: Skin Integrity: Goal: Risk for impaired skin integrity will decrease 09/13/2024 0742 by [name], RN Outcome: Not Progressing 09/12/2024 1835 by [name], RN Outcome: Progressing   Problem: Activity: Goal: Ability to tolerate increased activity will improve 09/13/2024 0742 by [name], RN Outcome: Not Progressing 09/12/2024 1835 by [name], RN Outcome: Progressing   Problem: Respiratory: Goal: Ability to maintain a clear airway and adequate ventilation will  improve 09/13/2024 0742 by [name], RN Outcome: Not Progressing 09/12/2024 1835 by [name], RN Outcome: Progressing   Problem: Role Relationship: Goal: Method of communication will improve 09/13/2024 0742 by  [name], RN Outcome: Not Progressing 09/12/2024 1835 by [name], RN Outcome: Progressing   Problem: Education: Goal: Knowledge of disease or condition will improve 09/13/2024 0742 by [name], RN Outcome: Not Progressing 09/12/2024 1835 by [name], RN Outcome: Progressing Goal: Knowledge of secondary prevention will improve (MUST DOCUMENT ALL) 09/13/2024 0742 by [name], RN Outcome: Not Progressing 09/12/2024 1835 by [name], RN Outcome: Progressing Goal: Knowledge of patient specific risk factors will improve (DELETE if not current risk factor) 09/13/2024 0742 by [name], RN Outcome: Not Progressing 09/12/2024 1835 by [name], RN Outcome: Progressing   Problem: Spontaneous Subarachnoid Hemorrhage Tissue Perfusion: Goal: Complications of Spontaneous Subarachnoid Hemorrhage will be minimized 09/13/2024 0742 by [name], RN Outcome: Not Progressing 09/12/2024 1835 by [name], RN Outcome: Progressing   Problem: Coping: Goal: Will verbalize positive feelings about self 09/13/2024 0742 by [name], RN Outcome: Not Progressing 09/12/2024 1835 by [name], RN Outcome: Progressing Goal: Will identify appropriate support needs 09/13/2024 0742 by [name], RN Outcome: Not Progressing 09/12/2024 1835 by [name], RN Outcome: Progressing   Problem: Health Behavior/Discharge Planning: Goal: Ability to manage health-related needs will improve 09/13/2024 0742 by [name], RN Outcome: Not Progressing 09/12/2024 1835 by [name], RN Outcome: Progressing Goal: Goals will  be collaboratively established with patient/family 09/13/2024 [phone] by [name], RN Outcome: Not Progressing 09/12/2024 1835 by [name], RN Outcome: Progressing   Problem: Self-Care: Goal: Ability to participate in self-care as condition permits will improve 09/13/2024 0742 by [name], RN Outcome: Not Progressing 09/12/2024 1835 by [name], RN Outcome: Progressing Goal: Verbalization of feelings and concerns over difficulty with self-care will improve 09/13/2024 0742 by [name], RN Outcome: Not Progressing 09/12/2024 1835 by [name], RN Outcome: Progressing Goal: Ability to communicate needs accurately will improve 09/13/2024 0742 by [name], RN Outcome: Not Progressing 09/12/2024 1835 by [name], RN Outcome: Progressing   Problem: Nutrition: Goal: Risk of aspiration will decrease 09/13/2024 0742 by [name], RN Outcome: Not Progressing 09/12/2024 1835 by [name], RN Outcome: Progressing Goal: Dietary intake will improve 09/13/2024 0742 by [name], RN Outcome: Not Progressing 09/12/2024 1835 by [name], RN Outcome: Progressing   Problem: Education: Goal: Understanding of CV disease, CV risk reduction, and recovery process will improve 09/13/2024 0742 by [name], RN Outcome: Not Progressing 09/12/2024 1835 by [name], RN Outcome: Progressing Goal: Individualized Educational Video(s) 09/13/2024 0742 by [name], RN Outcome: Not Progressing 09/12/2024 1835 by [name], RN Outcome: Progressing   Problem: Activity: Goal: Ability to return to baseline activity level will improve 09/13/2024 0742 by [name], RN Outcome: Not Progressing 09/12/2024 1835 by [name], RN Outcome: Progressing   Problem: Cardiovascular: Goal: Ability to achieve and maintain adequate  cardiovascular perfusion will improve 09/13/2024 0742 by [name], RN Outcome: Not Progressing 09/12/2024 1835 by [name], RN Outcome: Progressing Goal: Vascular access site(s) Level 0-1 will be maintained 09/13/2024 0742 by [name], RN Outcome: Not Progressing 09/12/2024 1835 by [name], RN Outcome: Progressing   Problem: Education: Goal: Knowledge of disease or condition will improve 09/13/2024 0742 by [name], RN Outcome: Not Progressing 09/12/2024 1835 by [name], RN Outcome: Progressing Goal: Knowledge of secondary prevention will improve (  MUST DOCUMENT ALL) 09/13/2024 0742 by [name], RN Outcome: Not Progressing 09/12/2024 1835 by [name], RN Outcome: Progressing Goal: Knowledge of patient specific risk factors will improve (DELETE if not current risk factor) 09/13/2024 0742 by [name], RN Outcome: Not Progressing 09/12/2024 1835 by [name], RN Outcome: Progressing   Problem: Spontaneous Subarachnoid Hemorrhage Tissue Perfusion: Goal: Complications of Spontaneous Subarachnoid Hemorrhage will be minimized 09/13/2024 0742 by [name], RN Outcome: Not Progressing 09/12/2024 1835 by [name], RN Outcome: Progressing   Problem: Coping: Goal: Will verbalize positive feelings about self 09/13/2024 0742 by [name], RN Outcome: Not Progressing 09/12/2024 1835 by [name], RN Outcome: Progressing Goal: Will identify appropriate support needs 09/13/2024 0742 by [name], RN Outcome: Not Progressing 09/12/2024 1835 by [name], RN Outcome: Progressing   Problem: Health Behavior/Discharge Planning: Goal: Ability to manage health-related needs will improve 09/13/2024 0742 by [name], RN Outcome: Not Progressing 09/12/2024 1835 by [name], RN Outcome:  Progressing Goal: Goals will be collaboratively established with patient/family 09/13/2024 [phone] by [name], RN Outcome: Not Progressing 09/12/2024 1835 by [name], RN Outcome: Progressing   Problem: Self-Care: Goal: Ability to participate in self-care as condition permits will improve 09/13/2024 0742 by [name], RN Outcome: Not Progressing 09/12/2024 1835 by [name], RN Outcome: Progressing Goal: Verbalization of feelings and concerns over difficulty with self-care will improve 09/13/2024 0742 by [name], RN Outcome: Not Progressing 09/12/2024 1835 by [name], RN Outcome: Progressing Goal: Ability to communicate needs accurately will improve 09/13/2024 0742 by [name], RN Outcome: Not Progressing 09/12/2024 1835 by [name], RN Outcome: Progressing   Problem: Nutrition: Goal: Risk of aspiration will decrease 09/13/2024 0742 by [name], RN Outcome: Not Progressing 09/12/2024 1835 by [name], RN Outcome: Progressing Goal: Dietary intake will improve 09/13/2024 0742 by [name], RN Outcome: Not Progressing 09/12/2024 1835 by [name], RN Outcome: Progressing

## 2024-09-13 NOTE — Progress Notes (Signed)
 PICC order received. Left message for daughter Mliss (pt preference, per RN), to call back for informed consent.

## 2024-09-13 NOTE — TOC Progression Note (Signed)
 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                     Expected Discharge Plan and Services                                               Social Drivers of Health (SDOH) Interventions SDOH Screenings   Food Insecurity: No Food Insecurity (09/08/2024)  Housing: Low Risk  (09/08/2024)  Transportation Needs: No Transportation Needs (09/08/2024)  Utilities: Not At Risk (09/08/2024)  Tobacco Use: High Risk (09/06/2024)    Readmission Risk Interventions     No data to display

## 2024-09-13 NOTE — Progress Notes (Signed)
 History Clinton Phillips is a 63 y.o. male who has no significant PMH. He presented to [facility] ED 11/24 with headache that started the night prior around midnight before he went to bed. He didn't wake up for work morning of 11/24 so family checked on him and found him to be altered. He was taken to [facility] where [facility] showed acute SAH with concern for Acomm rupture and small volume IVH with mild ventriculomegaly c/w hydrocephalus. 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 Underwent EVD placement and aneurysmal coiling. Extubated since then. TCDs reassuring.  The night of 11/30 the EVD stopped draining. MD was notified and the EVD was clamped.   12/1 patient got a CT at 7:44. Significant interval improvement of bilateral subarachnoid hemorrhage and No evidence of interval hemorrhage or adverse change found. Neuro exam was WNL with only slight proprioception deficit noted to the left great toe and slight behavioral inhibition present. No seizures overnight.   12/2 No change in the status of the patient. Neuro exam is as below. CT imaging obtained at 0302. No change in hydrocephalus or hemorrhage. Unchanged SAH. Small intraparenchymal hemorrhage noted along the catheter tract.    Assessment/Plan: Today the patient is neurologically unchanged with a reassuring exam and imaging. No signs of elevated ICP, CSF leak, infection, or rebleeding. CT reveals no advancing hydrocephalus, though close monitoring is still warranted. Repeat CT in 2 days to confirm no hydrocephalus. No new EVD indication at this time, will restart heparin .    LOS: 8 days  Repeat CT in 2 days  Restart Heparin   Continue with Q1 neuro checks.  Subjective: Patient reports slight headache. The patient denies any other concerns at this time.   Objective: Vital signs in last 24 hours: Temp:  [98 F (36.7 C)-101.1 F (38.4 C)] 98 F (36.7 C) (12/02 0310) Pulse Rate:  [71-116] 75 (12/02 0000) Resp:  [13-28] 21 (12/02  0600) BP: (130-168)/(64-103) 159/79 (12/02 0600) SpO2:  [91 %-98 %] 93 % (12/02 0400)  Intake/Output from previous day: 12/01 0701 - 12/02 0700 In: 1379.2 [P.O.:1180; IV Piggyback:199.2] Out: 1680 [Urine:1680] Intake/Output this shift: No intake/output data recorded.  Neurologic: Alert and oriented X 3, normal strength and tone. Normal symmetric reflexes. Normal coordination and gait Mental status: Alert, oriented, thought content appropriate, alertness: alert, orientation: time, date, person, place, affect: normal Cranial nerves: II: visual acuity normal bilaterally, II: visual field normal, II: pupils equal, round, reactive to light and accommodation, V: facial light touch sensation normal bilaterally, VII: upper facial muscle function normal bilaterally, VII: lower facial muscle function normal bilaterally, VIII: hearing normal Sensory: normal Motor: Normal  Lab Results: Recent Labs    09/13/24 0222  WBC 7.8  HGB 13.0  HCT 36.9*  PLT 239   BMET Recent Labs    09/12/24 0338 09/12/24 1023 09/12/24 1947 09/13/24 0222  NA 129*   < > 133* 133*  K 4.0  --   --  4.3  CL 97*  --   --  100  CO2 19*  --   --  24  GLUCOSE 156*  --   --  125*  BUN 21  --   --  16  CREATININE 1.19  --   --  1.15  CALCIUM 8.9  --   --  9.0   < > = values in this interval not displayed.    Studies/Results: CT HEAD WO CONTRAST ( ) Result Date: 09/13/2024 EXAM: CT HEAD WITHOUT 09/13/2024 03:02:06 AM TECHNIQUE:  CT of the head was performed without the administration of intravenous contrast. Automated exposure control, iterative reconstruction, and/or weight based adjustment of the mA/kV was utilized to reduce the radiation dose to as low as reasonably achievable. COMPARISON: 09/12/2024 CLINICAL HISTORY: Follow up on hydrocephalus FINDINGS: BRAIN AND VENTRICLES: EVD catheter has been removed. There is a small amount of intraparenchymal hemorrhage along catheter tract. Unchanged subarachnoid hemorrhage  along falx and bilateral convexities. 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 Stable hydrocephalus. Unchanged intraventricular hemorrhage in bilateral occipital horns. Aneurysm coil along anterior communicating artery with associated artifact. No mass effect or midline shift. No evidence of acute infarct. ORBITS: No acute abnormality. SINUSES AND MASTOIDS: No acute abnormality. SOFT TISSUES AND SKULL: No acute skull fracture. No acute soft tissue abnormality. IMPRESSION: 1. Stable hydrocephalus and unchanged intraventricular hemorrhage in the bilateral occipital horns. 2. Unchanged subarachnoid hemorrhage along the falx and bilateral convexities. 3. Small amount of intraparenchymal hemorrhage along the catheter tract. Electronically signed by: Franky Stanford MD 09/13/2024 03:13 AM EST RP Workstation: [id number]   VAS US  TRANSCRANIAL DOPPLER Result Date: 09/12/2024  Transcranial Doppler Patient Name:  Clinton Phillips  Date of Exam:   09/12/2024 Medical Rec #: [id number]        Accession #:    [id number] Date of Birth: 1961/01/08        Patient Gender: M Patient Age:   46 years Exam Location:  [facility] Procedure:      VAS US  TRANSCRANIAL DOPPLER Referring Phys: SAMMI GORE --------------------------------------------------------------------------------  Indications: Subarachnoid hemorrhage. Limitations: patient movement, patient talking, intermittent somnolence, patient              positioning Comparison Study: 09/06/2024, 09/07/2024, 09/09/2024 Performing Technologist: Cordella Collet RVT  Examination Guidelines: A complete evaluation includes B-mode imaging, spectral Doppler, color Doppler, and power Doppler as needed of all accessible portions of each vessel. Bilateral testing is considered an integral part of a complete examination. Limited examinations for reoccurring indications may be performed as noted.   +----------+---------------+----------+-----------+-------+ RIGHT TCD Right VM (cm/s)Depth (cm)PulsatilityComment +----------+---------------+----------+-----------+-------+ MCA             46                    1.23            +----------+---------------+----------+-----------+-------+ ACA             -51                   1.18            +----------+---------------+----------+-----------+-------+ Term ICA        17                    1.21            +----------+---------------+----------+-----------+-------+ PCA P1          -31                   1.36            +----------+---------------+----------+-----------+-------+ Opthalmic       18                    0.96            +----------+---------------+----------+-----------+-------+ ICA siphon      26  1.39            +----------+---------------+----------+-----------+-------+ Vertebral       -26                   1.44            +----------+---------------+----------+-----------+-------+ Distal ICA      -34                   1.21            +----------+---------------+----------+-----------+-------+  +----------+--------------+----------+-----------+------------------+ LEFT TCD  Left VM (cm/s)Depth (cm)Pulsatility     Comment       +----------+--------------+----------+-----------+------------------+ MCA             41                   1.31                       +----------+--------------+----------+-----------+------------------+ ACA                                          Unable to insonate +----------+--------------+----------+-----------+------------------+ Term ICA       -24                   1.38                       +----------+--------------+----------+-----------+------------------+ PCA P1          45                   1.26                       +----------+--------------+----------+-----------+------------------+ Opthalmic       19                    1.63                       +----------+--------------+----------+-----------+------------------+ ICA siphon      20                    1.5                       +----------+--------------+----------+-----------+------------------+ Vertebral      -17                   1.27                       +----------+--------------+----------+-----------+------------------+ Distal ICA     -28                   1.18                       +----------+--------------+----------+-----------+------------------+  +------------+-------+-------+             VM cm/sComment +------------+-------+-------+ Prox Basilar  -25   1.39   +------------+-------+-------+ Dist Basilar  -37   1.23   +------------+-------+-------+ +----------------------+----+ Right Lindegaard Ratio1.35 +----------------------+----+ +---------------------+----+ Left Lindegaard Ratio1.46 +---------------------+----+  Summary: This was a normal transcranial Doppler study, with normal flow direction and velocity of all identified vessels of the anterior and posterior circulations, with no evidence of stenosis, vasospasm or occlusion. There  was no evidence of intracranial disease. Increased pulsatility indices suggest increased intracranial pressure likely *See table(s) above for TCD measurements and observations.  Diagnosing physician: Eather Popp MD Electronically signed by Eather Popp MD on 09/12/2024 at 1:38:09 PM.    Final    CT HEAD WO CONTRAST ( ) Result Date: 09/12/2024 EXAM: CT HEAD WITHOUT CONTRAST 09/12/2024 07:44:10 AM TECHNIQUE: CT of the head was performed without the administration of intravenous contrast. Automated exposure control, iterative reconstruction, and/or weight based adjustment of the mA/kV was utilized to reduce the radiation dose to as low as reasonably achievable. COMPARISON: CT of the head dated 09/09/2024. CLINICAL HISTORY: Subarachnoid hemorrhage [facility]). FINDINGS: BRAIN AND VENTRICLES:  Significant interval improvement of bilateral subarachnoid hemorrhage. Marginal interval improvement of bilateral interventricular hemorrhage, which is seen largely independently within the occipital horns bilaterally. A right frontal approach ventriculostomy catheter remains with its tip near the left foramen of Monro. Embolization coils are again noted in the region of the anterior communicating artery. There is no evidence of interval hemorrhage or adverse change. A prominent cystic area is again demonstrated in the posterior fossa and eccentric to the left. No evidence of acute infarct. No mass effect or midline shift. ORBITS: No acute abnormality. SINUSES: No acute abnormality. SOFT TISSUES AND SKULL: No acute soft tissue abnormality. No skull fracture. IMPRESSION: 1. Significant interval improvement of bilateral subarachnoid hemorrhage and marginal interval improvement of bilateral interventricular hemorrhage. 2. No evidence of interval hemorrhage or adverse change. Electronically signed by: Evalene Coho MD 09/12/2024 08:05 AM EST RP Workstation: [id number] Precilla Purnell 09/13/2024, 7:17 AM

## 2024-09-14 ENCOUNTER — Inpatient Hospital Stay (HOSPITAL_COMMUNITY)

## 2024-09-14 DIAGNOSIS — I609 Nontraumatic subarachnoid hemorrhage, unspecified: Secondary | ICD-10-CM | POA: Diagnosis not present

## 2024-09-14 LAB — BASIC METABOLIC PANEL WITH GFR
Anion gap: 9 (ref 5–15)
BUN: 15 mg/dL (ref 8–23)
CO2: 23 mmol/L (ref 22–32)
Calcium: 8.8 mg/dL — ABNORMAL LOW (ref 8.9–10.3)
Chloride: 102 mmol/L (ref 98–111)
Creatinine, Ser: 1.02 mg/dL (ref 0.61–1.24)
GFR, Estimated: >60 mL/min (ref >60)
Glucose, Bld: 130 mg/dL — ABNORMAL HIGH (ref 70–99)
Potassium: 4.5 mmol/L (ref 3.5–5.1)
Sodium: 134 mmol/L — ABNORMAL LOW (ref 135–145)

## 2024-09-14 LAB — SODIUM
Sodium: 135 mmol/L (ref 135–145)
Sodium: 136 mmol/L (ref 135–145)
Sodium: 134 mmol/L — ABNORMAL LOW (ref 135–145)

## 2024-09-14 MED ORDER — LABETALOL HCL 5 MG/ML IV SOLN
10.0000 mg | INTRAVENOUS | Status: DC | PRN
  Administered 2024-09-14 – 2024-09-16 (×6): 10 mg via INTRAVENOUS
  Filled 2024-09-14 (×4): qty 4

## 2024-09-14 MED ORDER — HYDRALAZINE HCL 20 MG/ML IJ SOLN
10.0000 mg | INTRAMUSCULAR | Status: DC | PRN
  Administered 2024-09-14 – 2024-09-15 (×2): 10 mg via INTRAVENOUS
  Filled 2024-09-14 (×2): qty 1

## 2024-09-14 MED ORDER — LABETALOL HCL 5 MG/ML IV SOLN: 10.0000 mg | INTRAVENOUS | Status: DC | PRN

## 2024-09-14 NOTE — Progress Notes (Signed)
 NAME:  Clinton Phillips, MRN:  [id number], DOB:  [birth year], LOS: 9 ADMISSION DATE:  09/05/2024, CONSULTATION DATE:  09/05/24 REFERRING MD:  Suzanne - [facility] CHIEF COMPLAINT:  Headache   History of Present Illness:  Clinton Phillips is a 63 y.o. male who has no significant PMH. He presented to [facility] ED 11/24 with headache that started the night prior around midnight before he went to bed.  He didn't wake up for work morning of 11/24 so family checked on him and found him to be altered.  He was taken to [facility] where [facility] showed acute SAH with concern for Acomm rupture and small volume IVH with mild ventriculomegaly c/w hydrocephalus. 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   Underwent EVD placement and aneurysmal coiling. Extubated since then. TCDs reassuring.   Pertinent  Medical History:  has SAH (subarachnoid hemorrhage) (HCC); Obstructive hydrocephalus (HCC); and Hyponatremia on their problem list.  Significant Hospital Events: Including procedures, antibiotic start and stop dates in addition to other pertinent events   11/24 admit, underwent EVD placement and aneurysmal coiling 09/06/24 Extubated  09/08/24 low-grade fever 100.2 09/13/24 EVD removed  12/3 started on hypertonic saline at 75 cc/h, checking serum sodium every 6 hours.  Repeat head CT showing stable slight hydrocephalus  Interim History / Subjective:  Patient remained afebrile No overnight issues Serum sodium trended up to 134  Objective:  Blood pressure (!) 156/98, pulse (!) 58, temperature 99.4 F (37.4 C), temperature source Oral, resp. rate 19, SpO2 93%.        Intake/Output Summary (Last 24 hours) at 09/14/2024 0856 Last data filed at 09/14/2024 9340 Gross per 24 hour  Intake 2173.32 ml  Output 2500 ml  Net -326.68 ml    Physical Exam: General: Middle-aged male, lying on the bed HEENT: [place]/AT, eyes anicteric.  moist mucus membranes Neuro: Alert, awake following commands.  Antigravity in all 4  extremities. cranial nerve II through XII are intact Chest: Coarse breath sounds, no wheezes or rhonchi Heart: Regular rate and rhythm, no murmurs or gallops Abdomen: Soft, nontender, nondistended, bowel sounds present  Labs and images reviewed  Patient Lines/Drains/Airways Status     Active Line/Drains/Airways     Name Placement date Placement time Site Days   Peripheral IV 09/12/24 20 G Anterior;Left;Upper Arm 09/12/24  1552  Arm  2   PICC Double Lumen 09/13/24 Right Brachial 40 cm 0 cm 09/13/24  1704  -- 1   Wound 09/05/24 1632 Other (Comment) Arm Anterior;Distal;Lower;Right 09/05/24  1632  Arm  9            Assessment & Plan:  H/H 4, 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 [facility] consult placed for PCP needs    Labs   CBC: Recent Labs  Lab 09/08/24 0214 09/09/24 0305 09/10/24 0347 09/13/24 0222  WBC 5.9 5.9 6.5 7.8  NEUTROABS  --   --   --  5.5  HGB  11.9* 12.8* 13.2 13.0  HCT 35.4* 37.2* 37.2* 36.9*  MCV 95.2 94.4 93.2 92.5  PLT 204 197 201 239    Basic Metabolic Panel: Recent Labs  Lab 09/08/24 0214 09/09/24 0305 09/10/24 0347 09/11/24 0330 09/12/24 0338 09/12/24 1023 09/13/24 0222 09/13/24 0736 09/13/24 1323 09/13/24 1956 09/14/24 0218  NA 136 136 132* 128* 129*   < > 133* 131* 131* 133* 134*  K 3.9 3.9 4.2 4.3 4.0  --  4.3  --   --   --  4.5  CL 102 101 98 96*  97*  --  100  --   --   --  102  CO2 23 24 22 22  19*  --  24  --   --   --  23  GLUCOSE 101* 135* 118* 109* 156*  --  125*  --   --   --  130*  BUN 18 16 18 22 21   --  16  --   --   --  15  CREATININE 1.16 1.09 1.12 1.21 1.19  --  1.15  --   --   --  1.02  CALCIUM 8.7* 8.7* 9.0 8.7* 8.9  --  9.0  --   --   --  8.8*  MG 2.0 2.1 2.1  --   --   --  2.5*  --   --   --   --   PHOS 3.9 4.0 3.9  --   --   --  4.0  --   --   --   --    < > = values in this interval not displayed.   GFR: Estimated Creatinine Clearance: 92.8 mL/min (by C-G formula based on SCr of 1.02 mg/dL). Recent Labs  Lab 09/08/24 0214 09/09/24 0305 09/10/24 0347 09/13/24 0222  WBC 5.9 5.9 6.5 7.8    Liver Function Tests: No results for input(s): AST, ALT, ALKPHOS, BILITOT, PROT, ALBUMIN in the last 168 hours. No results for input(s): LIPASE, AMYLASE in the last 168 hours. No results for input(s): AMMONIA in the last 168 hours.  ABG    Component Value Date/Time   PHART 7.396 09/05/2024 1834   PCO2ART 40.9 09/05/2024 1834   PO2ART 288 (H) 09/05/2024 1834   HCO3 25.0 09/05/2024 1834   TCO2 26 09/05/2024 1834   ACIDBASEDEF 3.0 (H) 09/05/2024 1739   O2SAT 100 09/05/2024 1834     Coagulation Profile: No results for input(s): INR, PROTIME in the last 168 hours.   Cardiac Enzymes: No results for input(s): CKTOTAL, CKMB, CKMBINDEX, TROPONINI in the last 168 hours.  HbA1C: No results found for: HGBA1C  CBG: Recent Labs  Lab 09/07/24 1912 09/07/24 2318 09/08/24 0310 09/08/24 0748 09/12/24 2308  GLUCAP 157* 94 101* 129* 136*    The patient is critically ill due to subarachnoid hemorrhage status post coiling requiring close monitoring for vasospasm watch, hyponatremia on hypertonic saline requiring close adjustment of 3% solution.  Critical care was necessary to treat or prevent imminent or life-threatening deterioration.  Critical care was time spent personally by me on the  following activities: development of treatment plan with patient and/or surrogate as well as nursing, discussions with consultants, evaluation of patient's response to treatment, examination of patient, obtaining history from patient or surrogate, ordering and performing treatments and interventions, ordering and review of laboratory studies, ordering and review of radiographic studies, pulse oximetry, re-evaluation of patient's condition and participation in multidisciplinary rounds.   During this encounter critical care  time was devoted to patient care services described in this note for 31 minutes.     Valinda Novas, MD [facility] See Amion for pager If no response to pager, please call [phone] until 7pm After 7pm, Please call E-link [phone]

## 2024-09-14 NOTE — Progress Notes (Signed)
 Inpatient Rehab Admissions Coordinator:    CIR following. Pt. Remains on hypertonic saline, not ready for CIR at this time. Pt. Confirms fiance can assist 24/7 at d/c.  Leita Kleine, MS, CCC-SLP Rehab Admissions Coordinator  [phone] (celll) [phone] (office)

## 2024-09-14 NOTE — Progress Notes (Signed)
 History Clinton Phillips is a 63 y.o. male who has no significant PMH. He presented to [facility] ED 11/24 with headache that started the night prior around midnight before he went to bed. He didn't wake up for work morning of 11/24 so family checked on him and found him to be altered. He was taken to [facility] where [facility] showed acute SAH with concern for Acomm rupture and small volume IVH with mild ventriculomegaly c/w hydrocephalus. 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 Underwent EVD placement and aneurysmal coiling. Extubated since then. TCDs reassuring.   The night of 11/30 the EVD stopped draining. MD was notified and the EVD was clamped.    12/1 patient got a CT at 7:44. Significant interval improvement of bilateral subarachnoid hemorrhage and No evidence of interval hemorrhage or adverse change found. Neuro exam was WNL with only slight proprioception deficit noted to the left great toe and slight behavioral inhibition present. No seizures overnight.    12/2 No change in the status of the patient. Neuro exam is as below. CT imaging obtained at 0302. No change in hydrocephalus or hemorrhage. Unchanged SAH. Small intraparenchymal hemorrhage noted along the catheter tract.   12/3 0857 No change in the status of the patient. Neuro exam is as below. Heparin  5,000 units was restarted most recent administration 0608.  Assessment/Plan: Today the patient is neurologically unchanged with a reassuring exam and imaging. No signs of elevated ICP, CSF leak, infection, or rebleeding. Repeat CT in 1 days to confirm no hydrocephalus. No new EVD indication at this time.   LOS: 9 days  Repeat CT in 1 days  Continue with Q1 neuro checks.  Subjective: Patient denies any changes last night and states that they slept well. Denies Headache. Denies any concerns at this time.   Objective: Vital signs in last 24 hours: Temp:  [97.8 F (36.6 C)-99.7 F (37.6 C)] 99.4 F (37.4 C) (12/03 0800) Pulse Rate:   [57-88] 58 (12/03 0400) Resp:  [15-29] 19 (12/03 0800) BP: (120-181)/(48-151) 156/98 (12/03 0800) SpO2:  [93 %-96 %] 93 % (12/03 0400)  Intake/Output from previous day: 12/02 0701 - 12/03 0700 In: 2413.3 [P.O.:870; I.V.:1543.3] Out: 2500 [Urine:2500] Intake/Output this shift: No intake/output data recorded.  PHYSICAL EXAM  HENT:     Head: Normocephalic.     Nose: Nose normal.  Eyes:     Pupils: Pupils are equal, round, and reactive to light.  Cardiovascular:     Rate and Rhythm: Normal rate.  Pulmonary:     Effort: Pulmonary effort is normal.  Abdominal:     General: Abdomen is flat.  Musculoskeletal:     Cervical back: Normal range of motion.  Neurological:     Mental Status: Patient is alert and oriented. Patient frequently shifts position appearing restless.     Cranial Nerves: Cranial nerves 2-12 are intact.     Sensory: Sensation is intact.     Motor: Motor function is intact.    Coordination: Coordination is intact.  Skin:  Former EVD insertion site appears clean, dry, and intact with well-approximated sutures.   Lab Results: Recent Labs    09/13/24 0222  WBC 7.8  HGB 13.0  HCT 36.9*  PLT 239   BMET Recent Labs    09/13/24 0222 09/13/24 0736 09/13/24 1956 09/14/24 0218  NA 133*   < > 133* 134*  K 4.3  --   --  4.5  CL 100  --   --  102  CO2 24  --   --  23  GLUCOSE 125*  --   --  130*  BUN 16  --   --  15  CREATININE 1.15  --   --  1.02  CALCIUM 9.0  --   --  8.8*   < > = values in this interval not displayed.    Studies/Results: VAS US  TRANSCRANIAL DOPPLER Result Date: 09/13/2024  Transcranial Doppler Patient Name:  Clinton Phillips  Date of Exam:   09/13/2024 Medical Rec #: [id number]        Accession #:    [id number] Date of Birth: 11-Dec-1960        Patient Gender: M Patient Age:   22 years Exam Location:  [facility] Procedure:      VAS US  TRANSCRANIAL DOPPLER Referring Phys: DINO SABLE  --------------------------------------------------------------------------------  Indications: Subarachnoid hemorrhage. Limitations: VERY difficult study, patient uncooperative and continuously moving              and talking despite several attempts at redirection. Comparison Study: Previous exam on 09/12/2024 Performing Technologist: Ezzie Potters RVT, RDMS  Examination Guidelines: A complete evaluation includes B-mode imaging, spectral Doppler, color Doppler, and power Doppler as needed of all accessible portions of each vessel. Bilateral testing is considered an integral part of a complete examination. Limited examinations for reoccurring indications may be performed as noted.  +----------+---------------+----------+-----------+------------------+ RIGHT TCD Right VM (cm/s)Depth (cm)Pulsatility     Comment       +----------+---------------+----------+-----------+------------------+ MCA             58                    1.06                       +----------+---------------+----------+-----------+------------------+ ACA             -17                   1.01                       +----------+---------------+----------+-----------+------------------+ Term ICA        40                    1.30                       +----------+---------------+----------+-----------+------------------+ PCA P1          21                    1.23                       +----------+---------------+----------+-----------+------------------+ Opthalmic       22                    1.67                       +----------+---------------+----------+-----------+------------------+ ICA siphon                                    unable to insonate +----------+---------------+----------+-----------+------------------+ Vertebral       -26                   1.76                       +----------+---------------+----------+-----------+------------------+  Distal ICA      25                    1.13                        +----------+---------------+----------+-----------+------------------+  +----------+--------------+----------+-----------+------------------+ LEFT TCD  Left VM (cm/s)Depth (cm)Pulsatility     Comment       +----------+--------------+----------+-----------+------------------+ MCA             66                   1.33                       +----------+--------------+----------+-----------+------------------+ ACA                                          unable to insonate +----------+--------------+----------+-----------+------------------+ Term ICA        41                   1.40                       +----------+--------------+----------+-----------+------------------+ PCA P1          26                   1.24                       +----------+--------------+----------+-----------+------------------+ Opthalmic       22                   1.75                       +----------+--------------+----------+-----------+------------------+ ICA siphon                                   unable to insonate +----------+--------------+----------+-----------+------------------+ Vertebral      -35                   1.31                       +----------+--------------+----------+-----------+------------------+ Distal ICA      23                   1.34                       +----------+--------------+----------+-----------+------------------+  +------------+-------+------------------+             VM cm/s     Comment       +------------+-------+------------------+ Prox Basilar  -40         1.26        +------------+-------+------------------+ Dist Basilar       unable to insonate +------------+-------+------------------+ +----------------------+----+ Right Lindegaard Ratio2.32 +----------------------+----+ +---------------------+----+ Left Lindegaard Ratio2.87 +---------------------+----+    Preliminary    US  EKG SITE RITE Result Date:  09/13/2024 If Site Rite image not attached, placement could not be confirmed due to current cardiac rhythm.  CT HEAD WO CONTRAST ( ) Result Date: 09/13/2024 EXAM: CT HEAD WITHOUT 09/13/2024 03:02:06 AM TECHNIQUE: CT of the head was performed without the administration of intravenous  contrast. Automated exposure control, iterative reconstruction, and/or weight based adjustment of the mA/kV was utilized to reduce the radiation dose to as low as reasonably achievable. COMPARISON: 09/12/2024 CLINICAL HISTORY: Follow up on hydrocephalus FINDINGS: BRAIN AND VENTRICLES: EVD catheter has been removed. There is a small amount of intraparenchymal hemorrhage along catheter tract. Unchanged subarachnoid hemorrhage along falx and bilateral convexities. 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 Stable hydrocephalus. Unchanged intraventricular hemorrhage in bilateral occipital horns. Aneurysm coil along anterior communicating artery with associated artifact. No mass effect or midline shift. No evidence of acute infarct. ORBITS: No acute abnormality. SINUSES AND MASTOIDS: No acute abnormality. SOFT TISSUES AND SKULL: No acute skull fracture. No acute soft tissue abnormality. IMPRESSION: 1. Stable hydrocephalus and unchanged intraventricular hemorrhage in the bilateral occipital horns. 2. Unchanged subarachnoid hemorrhage along the falx and bilateral convexities. 3. Small amount of intraparenchymal hemorrhage along the catheter tract. Electronically signed by: Franky Stanford MD 09/13/2024 03:13 AM EST RP Workstation: [id number]   VAS US  TRANSCRANIAL DOPPLER Result Date: 09/12/2024  Transcranial Doppler Patient Name:  AVEL Gandolfi  Date of Exam:   09/12/2024 Medical Rec #: [id number]        Accession #:    [id number] Date of Birth: 1961-08-07        Patient Gender: M Patient Age:   61 years Exam Location:  [facility] Procedure:      VAS US  TRANSCRANIAL  DOPPLER Referring Phys: SAMMI GORE --------------------------------------------------------------------------------  Indications: Subarachnoid hemorrhage. Limitations: patient movement, patient talking, intermittent somnolence, patient              positioning Comparison Study: 09/06/2024, 09/07/2024, 09/09/2024 Performing Technologist: Cordella Collet RVT  Examination Guidelines: A complete evaluation includes B-mode imaging, spectral Doppler, color Doppler, and power Doppler as needed of all accessible portions of each vessel. Bilateral testing is considered an integral part of a complete examination. Limited examinations for reoccurring indications may be performed as noted.  +----------+---------------+----------+-----------+-------+ RIGHT TCD Right VM (cm/s)Depth (cm)PulsatilityComment +----------+---------------+----------+-----------+-------+ MCA             46                    1.23            +----------+---------------+----------+-----------+-------+ ACA             -51                   1.18            +----------+---------------+----------+-----------+-------+ Term ICA        17                    1.21            +----------+---------------+----------+-----------+-------+ PCA P1          -31                   1.36            +----------+---------------+----------+-----------+-------+ Opthalmic       18                    0.96            +----------+---------------+----------+-----------+-------+ ICA siphon      26                    1.39            +----------+---------------+----------+-----------+-------+  Vertebral       -26                   1.44            +----------+---------------+----------+-----------+-------+ Distal ICA      -34                   1.21            +----------+---------------+----------+-----------+-------+  +----------+--------------+----------+-----------+------------------+ LEFT TCD  Left VM (cm/s)Depth  (cm)Pulsatility     Comment       +----------+--------------+----------+-----------+------------------+ MCA             41                   1.31                       +----------+--------------+----------+-----------+------------------+ ACA                                          Unable to insonate +----------+--------------+----------+-----------+------------------+ Term ICA       -24                   1.38                       +----------+--------------+----------+-----------+------------------+ PCA P1          45                   1.26                       +----------+--------------+----------+-----------+------------------+ Opthalmic       19                   1.63                       +----------+--------------+----------+-----------+------------------+ ICA siphon      20                    1.5                       +----------+--------------+----------+-----------+------------------+ Vertebral      -17                   1.27                       +----------+--------------+----------+-----------+------------------+ Distal ICA     -28                   1.18                       +----------+--------------+----------+-----------+------------------+  +------------+-------+-------+             VM cm/sComment +------------+-------+-------+ Prox Basilar  -25   1.39   +------------+-------+-------+ Dist Basilar  -37   1.23   +------------+-------+-------+ +----------------------+----+ Right Lindegaard Ratio1.35 +----------------------+----+ +---------------------+----+ Left Lindegaard Ratio1.46 +---------------------+----+  Summary: This was a normal transcranial Doppler study, with normal flow direction and velocity of all identified vessels of the anterior and posterior circulations, with no evidence of stenosis, vasospasm or occlusion. There was no evidence of intracranial disease. Increased pulsatility indices suggest increased intracranial  pressure likely *See table(s) above for TCD measurements and observations.  Diagnosing physician: Eather Popp MD Electronically signed by Eather Popp MD on 09/12/2024 at 1:38:09 PM.    Final        Jayson PARAS Zurisadai Helminiak 09/14/2024, 8:56 AM

## 2024-09-14 NOTE — Progress Notes (Addendum)
 2000 - eLink notified of hypertension refractory to multiple doses of labetalol . Also informed of Na 134. Awaiting recs.   2300 - eLink notified labetalol  has been ineffective. Requested add on PRN antihypertensive. Awaiting recs.

## 2024-09-14 NOTE — Progress Notes (Signed)
 Speech Language Pathology Treatment: Cognitive-Linguistic  Patient Details Name: Clinton Phillips MRN: [id number] DOB: 09/22/1961 Today's Date: 09/14/2024 Time: 8457-8399 SLP Time Calculation (min) (ACUTE ONLY): 18 min  Assessment / Plan / Recommendation Clinical Impression  Pt completed a med management task in which he was asked to identify errors within a pillbox. He needed fading Max-Mod cueing to do so, primarily exhibiting deficits related to sustained attention, working memory, and problem solving. When an error was identified, cueing was necessary for correction. Discussed intensive SLP f/u for functional cognitive tasks, with which pt is agreeable.    HPI HPI: Clinton Phillips is a 63 y.o. male admitted and intubated on 11/24 due to acute SAH and small volume IVH with mild ventriculomegaly with hydrocephalus and confirmed anterior communicating aneurysm. Pt underwent EVD placement and aneurysmal coiling  11/25. ETT 11/24-11/25.      SLP Plan  Continue with current plan of care        Swallow Evaluation Recommendations         Recommendations                         Frequent or constant Supervision/Assistance Cognitive communication deficit (R41.841);Attention and concentration deficit Nontraumatic SAH;Nontraumatic intracerebral hemorrhage   Continue with current plan of care     Damien Blumenthal, M.A., CCC-SLP Speech Language Pathology, Acute Rehabilitation Services  Secure Chat preferred [phone]   09/14/2024, 4:25 PM

## 2024-09-14 NOTE — Progress Notes (Signed)
 Transcranial Doppler   Date POD PCO2 HCT BP   MCA ACA PCA OPHT SIPH VERT Basilar  11/25 JH 1   35.9 96/68 Right  Left   69  44   *  -22   16  18   15  11    *  *   -14  -23   -38       11/26 JH  2    33 121/78  Right  Left    70   53    *   -22    21   23    17   16     *   *    -24   -23    -37       11/28   4    37  162/102 Right  Left    52   58    -27   *    22   28    25   24    29    *    -22   -24    -35        12/1 GC           Right  Left    46   41    -51   *    -31   45    18   19    26   20     -26   -17    -37        12/2 JH   8   36.9  175/90  Right  Left    58   66    -17   *    21   26    22   22     *   *    -26   -35    -40        12/3.rs         Right  Left    72   61    -46   -32    23   34    23   26    37   45    -30   -43    -30                 Right  Left                                                       MCA = Middle Cerebral Artery      OPHT = Opthalmic Artery     BASILAR = Basilar Artery   ACA = Anterior Cerebral Artery     SIPH = Carotid Siphon PCA = Posterior Cerebral Artery   VERT = Verterbral Artery                    Normal MCA = 62+\-12 ACA = 50+\-12 PCA = 42+\-23    Right Lindegaard Ratio: 2.3 Left Lindegaard Ratio: 1.9 Yanira Tolsma Jones, RDMS, RVT

## 2024-09-14 NOTE — Progress Notes (Signed)
 Physical Therapy Treatment Patient Details Name: Clinton Phillips MRN: [id number] DOB: 11/25/1960 Today's Date: 09/14/2024   History of Present Illness 63 yo male presents to [facility] from [facility] on 11/24 with headache, AMS. CTH showed acute SAH with concern for Acomm rupture and small volume IVH with mild ventriculomegaly c/w hydrocephalus. EVD 11/24-12/1. ETT 11/24-11/25. PMHx: none.    PT Comments  Pt continues with c/o generalized stiffness and soarness t/o body. Pt continues to present with both functional and cognitive deficits requiring assist for both mobility and ADLs. Pt easily distracted with poor attn to task, impaired processing poor recall, decreased insight to situation and safety, impaired balance with increased fall risk, and is unable to follow multistep commands. Pt to continue to benefit from aggressive inpatient rehab program > 3 hrs a day to address above deficits and progress back towards indep. Acute PT to cont to follow.   If plan is discharge home, recommend the following: A lot of help with walking and/or transfers;A lot of help with bathing/dressing/bathroom   Can travel by private vehicle        Equipment Recommendations  None recommended by PT    Recommendations for Other Services       Precautions / Restrictions Precautions Precautions: Fall Recall of Precautions/Restrictions: Impaired Precaution/Restrictions Comments: EVD d/c 12/1 Restrictions Weight Bearing Restrictions Per Provider Order: No     Mobility  Bed Mobility Overal bed mobility: Needs Assistance Bed Mobility: Supine to Sit     Supine to sit: Mod assist     General bed mobility comments: max directional verbal and tactile cues for log roll to sidelying to sit up EOB, modA for trunk elevation, increased time, pt c/o cramp in L side    Transfers Overall transfer level: Needs assistance Equipment used: 2 person hand held assist Transfers: Sit to/from Stand Sit to Stand: Min  assist, +2 physical assistance, +2 safety/equipment           General transfer comment: min A +2 for initial STS, pt with posterior bias/back extension past neutral hips    Ambulation/Gait Ambulation/Gait assistance: +2 safety/equipment, Min assist Gait Distance (Feet): 160 Feet Assistive device: 2 person hand held assist Gait Pattern/deviations: Step-through pattern, Decreased stride length, Trunk flexed, Leaning posteriorly, Ataxic, Scissoring Gait velocity: decr Gait velocity interpretation: 1.31 - 2.62 ft/sec, indicative of limited community ambulator   General Gait Details: pt continues with trunk extension despite max verbal and tactile cues, pt stopping frequently with noted deep breathing but VSS, pt unable to multitask requiring verbal cues to remind pt of what the asked task was ie. find room number 28, pt with 3 episodes of LOB and cross over gait requiring minA to prevent fall   Stairs             Wheelchair Mobility     Tilt Bed    Modified Rankin (Stroke Patients Only)       Balance Overall balance assessment: Needs assistance Sitting-balance support: Feet supported Sitting balance-Leahy Scale: Good     Standing balance support: Bilateral upper extremity supported, During functional activity Standing balance-Leahy Scale: Poor Standing balance comment: pt with poor standing tolerance, had to lean against sink and put arm up on wall while brushing teeth                            Communication Communication Communication: No apparent difficulties Factors Affecting Communication: Hearing impaired  Cognition Arousal: Alert Behavior During Therapy:  Restless, Impulsive   PT - Cognitive impairments: Awareness, Memory, Attention, Sequencing, Problem solving, Safety/Judgement                       PT - Cognition Comments: easily distracted, verbose/tangential, pt unable to identify date despite verbal cues, pt perseverative at times,  impaired sequencing Following commands: Impaired Following commands impaired: Follows one step commands with increased time    Cueing Cueing Techniques: Verbal cues, Gestural cues  Exercises      General Comments General comments (skin integrity, edema, etc.): VSS      Pertinent Vitals/Pain Pain Assessment Pain Assessment: Faces Faces Pain Scale: Hurts even more Pain Location: low back Pain Descriptors / Indicators: Discomfort, Grimacing, Guarding, Cramping (stiffness) Pain Intervention(s): Monitored during session    Home Living                          Prior Function            PT Goals (current goals can now be found in the care plan section) Acute Rehab PT Goals Patient Stated Goal: home PT Goal Formulation: With patient Time For Goal Achievement: 09/20/24 Potential to Achieve Goals: Good Progress towards PT goals: Progressing toward goals    Frequency    Min 3X/week      PT Plan      Co-evaluation PT/OT/SLP Co-Evaluation/Treatment: Yes Reason for Co-Treatment: Complexity of the patient's impairments (multi-system involvement);For patient/therapist safety;To address functional/ADL transfers PT goals addressed during session: Mobility/safety with mobility;Balance        AM-PAC PT 6 Clicks Mobility   Outcome Measure  Help needed turning from your back to your side while in a flat bed without using bedrails?: A Little Help needed moving from lying on your back to sitting on the side of a flat bed without using bedrails?: A Little Help needed moving to and from a bed to a chair (including a wheelchair)?: A Little Help needed standing up from a chair using your arms (e.g., wheelchair or bedside chair)?: A Little Help needed to walk in hospital room?: A Lot Help needed climbing 3-5 steps with a railing? : Total 6 Click Score: 15    End of Session Equipment Utilized During Treatment: Gait belt Activity Tolerance: Patient limited by  fatigue Patient left: in chair;with call bell/phone within reach;with chair alarm set (posey alarm belt) Nurse Communication: Mobility status PT Visit Diagnosis: Other abnormalities of gait and mobility (R26.89);Muscle weakness (generalized) (M62.81)     Time: 8891-8858 PT Time Calculation (min) (ACUTE ONLY): 33 min  Charges:    $Gait Training: 8-22 mins PT General Charges $$ ACUTE PT VISIT: 1 Visit                     Norene Ames, PT, DPT Acute Rehabilitation Services Secure chat preferred Office #: [phone]    Norene CHRISTELLA Ames 09/14/2024, 12:46 PM

## 2024-09-14 NOTE — Evaluation (Signed)
 Occupational Therapy Evaluation Patient Details Name: Clinton Phillips MRN: [id number] DOB: [birth year] Today's Date: 09/14/2024   History of Present Illness   63 yo male presents to [facility] from [facility] on 11/24 with headache, AMS. CTH showed acute SAH with concern for Acomm rupture and small volume IVH with mild ventriculomegaly c/w hydrocephalus. EVD 11/24-12/1. ETT 11/24-11/25. PMHx: none.     Clinical Impressions Pt progressing toward established OT goals. Continues to mask cognitive deficits by humor and socialization. Pt needing up to mod cues for simple path finding in hall, however, mobility much improved needing min A +2 safety. Pt engaged in grooming task in room and needing cues to recall items needed for task; then able to sequence without cues. Noting decr activity tolerance, supporting self on sink and placing arm on paper towel dispenser. Continue to highly recommend intensive inpatient rehab after discharge.      If plan is discharge home, recommend the following:   A lot of help with walking and/or transfers;A lot of help with bathing/dressing/bathroom;Assistance with cooking/housework;Assist for transportation     Functional Status Assessment         Equipment Recommendations   Other (comment) (defer)     Recommendations for Other Services   Rehab consult     Precautions/Restrictions   Precautions Precautions: Fall Recall of Precautions/Restrictions: Impaired Precaution/Restrictions Comments: EVD d/c 12/1 Restrictions Weight Bearing Restrictions Per Provider Order: No     Mobility Bed Mobility Overal bed mobility: Needs Assistance Bed Mobility: Supine to Sit     Supine to sit: Mod assist     General bed mobility comments: max directional verbal and tactile cues for log roll to sidelying to sit up EOB, modA for trunk elevation, increased time, pt c/o cramp in L side    Transfers Overall transfer level: Needs assistance Equipment  used: 2 person hand held assist Transfers: Sit to/from Stand Sit to Stand: Min assist, +2 physical assistance, +2 safety/equipment           General transfer comment: min A +2 for initial STS, pt with posterior bias/back extension past neutral hips      Balance Overall balance assessment: Needs assistance Sitting-balance support: Feet supported Sitting balance-Leahy Scale: Good     Standing balance support: Bilateral upper extremity supported, During functional activity Standing balance-Leahy Scale: Poor Standing balance comment: pt with poor standing tolerance, had to lean against sink and put arm up on wall while brushing teeth                           ADL either performed or assessed with clinical judgement   ADL Overall ADL's : Needs assistance/impaired     Grooming: Minimal assistance;Standing;Cueing for sequencing                   Toilet Transfer: Minimal assistance;+2 for safety/equipment;Ambulation           Functional mobility during ADLs: Minimal assistance;+2 for safety/equipment       Vision         Perception         Praxis         Pertinent Vitals/Pain Pain Assessment Pain Assessment: Faces Faces Pain Scale: Hurts even more Pain Location: low back Pain Descriptors / Indicators: Discomfort, Grimacing, Guarding, Cramping (stiffness) Pain Intervention(s): Limited activity within patient's tolerance, Monitored during session     Extremity/Trunk Assessment  Communication Communication Communication: No apparent difficulties Factors Affecting Communication: Hearing impaired   Cognition Arousal: Alert Behavior During Therapy: Restless, Impulsive Cognition: Cognition impaired   Orientation impairments: Time (month) Awareness: Intellectual awareness impaired, Online awareness impaired Memory impairment (select all impairments): Short-term memory, Working civil service fast streamer, Engineer, structural memory Attention  impairment (select first level of impairment): Sustained attention Executive functioning impairment (select all impairments): Initiation, Organization, Sequencing, Reasoning, Problem solving OT - Cognition Comments: continues to joke (deflecting?), needs significant cues, follows 1 step commands. limited insight to impiarments, perseverating on stiffness. pt reporting it is February or March, needing max increased time to indicate which month Thanksgiving is in and direct cues to identify now December. When provided toothbrush for teeth needs cues to identify other items he needs (toothpaste and to go to the sink) perseverated on oral care                 Following commands: Impaired Following commands impaired: Follows one step commands with increased time     Cueing  General Comments   Cueing Techniques: Verbal cues;Gestural cues  VSS   Exercises     Shoulder Instructions      Home Living                                          Prior Functioning/Environment                      OT Problem List:     OT Treatment/Interventions:        OT Goals(Current goals can be found in the care plan section)   Acute Rehab OT Goals Patient Stated Goal: get better and go home OT Goal Formulation: With patient Time For Goal Achievement: 09/20/24 Potential to Achieve Goals: Good ADL Goals Pt Will Perform Grooming: with supervision;standing Pt Will Perform Upper Body Dressing: with supervision Pt Will Perform Lower Body Dressing: with supervision;sit to/from stand Pt Will Transfer to Toilet: with supervision;ambulating Additional ADL Goal #1: Pt will indep sequence through a basic ADL task   OT Frequency:  Min 2X/week    Co-evaluation PT/OT/SLP Co-Evaluation/Treatment: Yes Reason for Co-Treatment: Complexity of the patient's impairments (multi-system involvement);For patient/therapist safety;To address functional/ADL transfers PT goals addressed  during session: Mobility/safety with mobility;Balance OT goals addressed during session: ADL's and self-care (cognition)      AM-PAC OT 6 Clicks Daily Activity     Outcome Measure Help from another person eating meals?: A Little Help from another person taking care of personal grooming?: A Little Help from another person toileting, which includes using toliet, bedpan, or urinal?: A Lot Help from another person bathing (including washing, rinsing, drying)?: A Lot Help from another person to put on and taking off regular upper body clothing?: A Lot Help from another person to put on and taking off regular lower body clothing?: A Lot 6 Click Score: 14   End of Session Equipment Utilized During Treatment: Gait belt Nurse Communication: Mobility status  Activity Tolerance: Patient tolerated treatment well Patient left: in chair;with call bell/phone within reach;with chair alarm set;with family/visitor present  OT Visit Diagnosis: Unsteadiness on feet (R26.81);Other abnormalities of gait and mobility (R26.89);Muscle weakness (generalized) (M62.81);Pain                Time: 1111-1141 OT Time Calculation (min): 30 min Charges:  OT General Charges $OT Visit: 1 Visit OT Treatments $  Self Care/Home Management : 8-22 mins  Elma JONETTA Lebron FREDERICK, OTR/L [facility] Acute Rehabilitation Office: [phone]   Elma JONETTA Lebron 09/14/2024, 1:44 PM

## 2024-09-14 NOTE — Progress Notes (Addendum)
 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

## 2024-09-15 ENCOUNTER — Other Ambulatory Visit: Payer: Self-pay

## 2024-09-15 ENCOUNTER — Inpatient Hospital Stay (HOSPITAL_COMMUNITY)

## 2024-09-15 DIAGNOSIS — I609 Nontraumatic subarachnoid hemorrhage, unspecified: Secondary | ICD-10-CM

## 2024-09-15 LAB — BASIC METABOLIC PANEL WITH GFR
Anion gap: 8 (ref 5–15)
BUN: 15 mg/dL (ref 8–23)
CO2: 21 mmol/L — ABNORMAL LOW (ref 22–32)
Calcium: 8.8 mg/dL — ABNORMAL LOW (ref 8.9–10.3)
Chloride: 107 mmol/L (ref 98–111)
Creatinine, Ser: 0.93 mg/dL (ref 0.61–1.24)
GFR, Estimated: >60 mL/min (ref >60)
Glucose, Bld: 124 mg/dL — ABNORMAL HIGH (ref 70–99)
Potassium: 4 mmol/L (ref 3.5–5.1)
Sodium: 136 mmol/L (ref 135–145)

## 2024-09-15 LAB — SODIUM
Sodium: 135 mmol/L (ref 135–145)
Sodium: 135 mmol/L (ref 135–145)
Sodium: 140 mmol/L (ref 135–145)
Sodium: 139 mmol/L (ref 135–145)

## 2024-09-15 MED ORDER — LOSARTAN POTASSIUM 50 MG PO TABS
50.0000 mg | ORAL_TABLET | Freq: Every day | ORAL | Status: DC
  Administered 2024-09-15 – 2024-09-19 (×5): 50 mg via ORAL
  Filled 2024-09-15 (×5): qty 1

## 2024-09-15 MED ORDER — SODIUM CHLORIDE 1 G PO TABS
2.0000 g | ORAL_TABLET | Freq: Three times a day (TID) | ORAL | Status: DC
  Administered 2024-09-15 – 2024-09-19 (×13): 2 g via ORAL
  Filled 2024-09-15 (×13): qty 2

## 2024-09-15 NOTE — Progress Notes (Addendum)
 NAME:  Clinton Phillips, MRN:  [id number], DOB:  Mar 15, 1961, LOS: 10 ADMISSION DATE:  09/05/2024, CONSULTATION DATE:  09/05/24 REFERRING MD:  Suzanne - [facility] CHIEF COMPLAINT:  Headache   History of Present Illness:  Clinton Phillips is a 63 y.o. male who has no significant PMH. He presented to [facility] ED 11/24 with headache that started the night prior around midnight before he went to bed.  He didn't wake up for work morning of 11/24 so family checked on him and found him to be altered.  He was taken to [facility] where [facility] showed acute SAH with concern for Acomm rupture and small volume IVH with mild ventriculomegaly c/w hydrocephalus. 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   Underwent EVD placement and aneurysmal coiling. Extubated since then. TCDs reassuring.   Pertinent  Medical History:  has SAH (subarachnoid hemorrhage) (HCC); Obstructive hydrocephalus (HCC); and Hyponatremia on their problem list.  Significant Hospital Events: Including procedures, antibiotic start and stop dates in addition to other pertinent events   11/24 admit, underwent EVD placement and aneurysmal coiling 09/06/24 Extubated  09/08/24 low-grade fever 100.2 09/13/24 EVD removed  12/3 started on hypertonic saline at 75 cc/h, checking serum sodium every 6 hours. Repeat head CT showing stable slight hydrocephalus   Interim History / Subjective:  Overnight: Patient had elevated blood pressures into the 160s.  As needed hydralazine restarted.  Patient evaluated bedside this morning.  He states he is having a minor headache, but otherwise he is doing well.  He is eating and drinking well.  He has been having normal bowel movements.  He has no concerns this morning. He states he was a former smoker and that he has stopped smoking.  He states he is giving his life to God now.  Objective:  Blood pressure (!) 162/96, pulse (!) 54, temperature 98.4 F (36.9 C), temperature source Oral, resp. rate 19, SpO2 97%.         Intake/Output Summary (Last 24 hours) at 09/15/2024 0649 Last data filed at 09/15/2024 0600 Gross per 24 hour  Intake 2830.18 ml  Output 2425 ml  Net 405.18 ml   Afebrile Blood pressure: 140s-180s systolics Heart rate 40s-80s (54) Satting at 97% on room air  Physical Exam: General: Resting bed, no acute distress, eating breakfast Neuro: Alert and oriented x 3, cranial nerves II through XII intact.  Sensation intact to bilateral upper and lower extremities.  5/5 strength noted to bilateral upper and lower extremities. HEENT: Healing well to scalp. Cardiovascular: Regular rate and rhythm Lungs: Clear to auscultation bilaterally  Abdomen: Soft, nontender, nondistended Musculoskeletal: No edema   Labs/imaging personally reviewed:  CT head 11/24 > acute SAH with concern for Acom rupture and small volume IVH with mild ventriculomegaly c/w hydrocephalus. CTA head 11/24 >  small Acomm aneurysm.  CT head 12/2: 1. Stable hydrocephalus and unchanged intraventricular hemorrhage in the bilateral occipital horns. 2. Unchanged subarachnoid hemorrhage along the falx and bilateral convexities. 3. Small amount of intraparenchymal hemorrhage along the catheter tract.  CT head 12/4: 1. Stable small volume residual intracranial hemorrhage. Stable ventricle size and configuration. Anterior communicating artery coil pack. 2. No new intracranial abnormality.   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  No new acute abnormalities.  Patient evaluated bedside this morning and has no neurological deficits.  Patient with elevated blood pressures into the 160s and  180s.  Patient was started on as needed hydralazine last night and is also on as needed labetalol .  At some point during hospitalization patient would benefit from ACE or ARB for blood pressure control.  Sodium has normalized to 136.  Patient continues to work with PT and OT. 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  Hypertonic saline and sodium chloride  tablets have improved sodium to 136 this morning.  - Monitor sodium every 6 hours - Increase sodium chloride  2 g 3 times daily with meals - Continue hypertonic saline, check Na this afternoon, if normal can stop hypertonic saline and transfer out of ICU   #Fever, resolved Fever has resolved.  No acute concerns for infectious process at this time  #No PCP TOC following  DVT ppx : heparin    Labs   CBC: Recent Labs  Lab 09/09/24 0305 09/10/24 0347 09/13/24 0222  WBC 5.9 6.5 7.8  NEUTROABS  --   --  5.5  HGB 12.8* 13.2 13.0  HCT 37.2* 37.2* 36.9*  MCV 94.4 93.2 92.5  PLT 197 201 239    Basic Metabolic Panel: Recent Labs  Lab 09/09/24 0305 09/10/24 0347 09/11/24 0330 09/12/24 0338 09/12/24 1023 09/13/24 0222 09/13/24 0736 09/14/24 0218 09/14/24 0904 09/14/24 1558 09/14/24 1942 09/15/24 0206 09/15/24 0513  NA 136 132* 128* 129*   < > 133*   < > 134* 135 136 134* 135 136  K 3.9 4.2 4.3 4.0  --  4.3  --  4.5  --   --   --   --  4.0  CL 101 98 96*  97*  --  100  --  102  --   --   --   --  107  CO2 24 22 22  19*  --  24  --  23  --   --   --   --  21*  GLUCOSE 135* 118* 109* 156*  --  125*  --  130*  --   --   --   --  124*  BUN 16 18 22 21   --  16  --  15  --   --   --   --  15  CREATININE 1.09 1.12 1.21 1.19  --  1.15  --  1.02  --   --   --   --  0.93  CALCIUM 8.7* 9.0 8.7* 8.9  --  9.0  --  8.8*  --   --   --   --  8.8*  MG 2.1 2.1  --   --   --  2.5*  --   --   --   --   --   --   --   PHOS 4.0 3.9  --   --   --  4.0  --   --   --   --   --   --   --    < > = values in this interval not displayed.   GFR: Estimated Creatinine Clearance: 101.8 mL/min (by C-G formula based on SCr of 0.93 mg/dL). Recent Labs  Lab 09/09/24 0305 09/10/24 0347 09/13/24 0222  WBC 5.9 6.5 7.8    Liver Function Tests: No results for input(s): AST, ALT, ALKPHOS, BILITOT, PROT, ALBUMIN in the last 168 hours. No results for input(s): LIPASE, AMYLASE in the last 168 hours. No results for input(s): AMMONIA in the last 168 hours.  ABG    Component Value Date/Time   PHART 7.396 09/05/2024 1834   PCO2ART 40.9 09/05/2024 1834   PO2ART 288 (H) 09/05/2024 1834   HCO3 25.0 09/05/2024 1834   TCO2 26 09/05/2024 1834   ACIDBASEDEF 3.0 (H) 09/05/2024 1739   O2SAT 100 09/05/2024 1834     Coagulation Profile: No results for input(s): INR, PROTIME in the last 168 hours.   Cardiac Enzymes: No results for input(s): CKTOTAL, CKMB, CKMBINDEX, TROPONINI in the last 168 hours.  HbA1C: No results found for: HGBA1C  CBG: Recent Labs  Lab 09/08/24 0748 09/12/24 2308  GLUCAP 129* 136*    Review of Systems:   Unable to obtain as pt is encephalopathic.  Past Medical History:  He,  has a past medical history of Anemia.   Surgical History:   Past Surgical History:  Procedure Laterality Date   IR 3D INDEPENDENT WKST  09/05/2024   IR ANGIO INTRA EXTRACRAN SEL COM CAROTID INNOMINATE UNI L MOD SED  09/05/2024   IR  ANGIOGRAM FOLLOW UP STUDY  09/05/2024   IR ANGIOGRAM FOLLOW UP STUDY  09/05/2024   IR ANGIOGRAM FOLLOW UP STUDY  09/05/2024   IR CT HEAD LTD  09/05/2024   IR TRANSCATH/EMBOLIZ  09/05/2024   IR US  GUIDE VASC ACCESS RIGHT  09/05/2024   RADIOLOGY WITH ANESTHESIA N/A 09/05/2024   Procedure: RADIOLOGY WITH ANESTHESIA;  Surgeon: Radiologist, Medication, MD;  Location: [facility] OR;  Service: Radiology;  Laterality: N/A;     Social History:   reports that he has been smoking cigarettes. He has a 23 pack-year smoking history. He has never used smokeless tobacco. He reports current alcohol use of about 12.0 standard drinks of alcohol per week. He reports current drug use. Drug: Marijuana.   Family History:  His family history is not on file.   Allergies No Known Allergies   Home Medications  Prior to Admission medications   Medication Sig Start Date End Date Taking? Authorizing Provider  cyclobenzaprine  (FLEXERIL ) 5 MG tablet Take 1-2 tablets (5-10 mg total) by mouth 3 (three) times daily as needed for muscle spasms. Patient not taking: Reported on 09/05/2024 03/18/23   Charlene Debby BROCKS, PA-C  cyclobenzaprine  (FLEXERIL ) 5 MG tablet Take 1-2 tablets (5-10 mg total) by mouth 3 (three) times daily as needed for muscle spasms. Patient not taking: Reported on 09/05/2024 03/18/23   Gaines, Thomas C, PA-C  HYDROcodone -acetaminophen  (NORCO) 5-325 MG tablet Take 1 tablet by mouth every 6 (six) hours as needed for moderate pain. Patient not taking: Reported on 09/05/2024 03/18/23   Gaines, Thomas C, PA-C  ondansetron  (ZOFRAN  ODT) 4 MG disintegrating tablet Take 1 tablet (4 mg total) by mouth every 8 (eight) hours as needed for nausea or vomiting. Patient not taking: Reported on 09/05/2024 01/05/21   Angelena Smalls, MD  predniSONE  (DELTASONE ) 10 MG tablet Take 1 tablet (10  mg total) by mouth daily. 6,5,4,3,2,1 six day taper Patient not taking: Reported on 09/05/2024 03/18/23   Charlene Debby BROCKS, PA-C     Lynsey Ange,  DO Internal Medicine Resident PGY-3

## 2024-09-15 NOTE — Progress Notes (Signed)
 Transcranial Doppler   Date POD PCO2 HCT BP   MCA ACA PCA OPHT SIPH VERT Basilar  11/25 JH 1   35.9 96/68 Right  Left   69  44   *  -22   16  18   15  11    *  *   -14  -23   -38       11/26 JH  2    33 121/78  Right  Left    70   53    *   -22    21   23    17   16     *   *    -24   -23    -37       11/28   4    37  162/102 Right  Left    52   58    -27   *    22   28    25   24    29    *    -22   -24    -35        12/1 GC           Right  Left    46   41    -51   *    -31   45    18   19    26   20     -26   -17    -37        12/2 JH   8   36.9  175/90  Right  Left    58   66    -17   *    21   26    22   22     *   *    -26   -35    -40        12/4 JH         Right  Left    84   93    *   *    20   44    17   20    41   *    -26   -33    -28                 Right  Left                                                       MCA = Middle Cerebral Artery      OPHT = Opthalmic Artery     BASILAR = Basilar Artery   ACA = Anterior Cerebral Artery     SIPH = Carotid Siphon PCA = Posterior Cerebral Artery   VERT = Verterbral Artery                    Normal MCA = 62+\-12 ACA = 50+\-12 PCA = 42+\-23    Right Lindegaard Ratio: 2.05 Left Lindegaard Ratio: 3.0   Results can be found under chart review under CV PROC.  Rosaline Fujisawa, MHA, RVT, RDCS, RDMS for [facility], RVT, RDMS.

## 2024-09-15 NOTE — Progress Notes (Signed)
 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 11/24 EVD and coil embolization. ETT 11/24-11/25. 12/1 EVD removed. PMHx: none.    PT Comments  Pt pleasant, tangential at times and demonstrating increased balance, activity tolerance and gait. Pt with ability to follow 2 step commands 50% of the time and single step commands 75% of the time. Pt and fiancee report 24hr assist and transportation available. Pt with significant mobility improvement who will continue to benefit from acute therapy as well as OPPT.   Pre gait 126/75, post gait 139/63   If plan is discharge home, recommend the following: A little help with walking and/or transfers;A little help with bathing/dressing/bathroom;Assistance with cooking/housework;Direct supervision/assist for financial management;Supervision due to cognitive status;Assist for transportation;Help with stairs or ramp for entrance   Can travel by private vehicle        Equipment Recommendations  None recommended by PT    Recommendations for Other Services       Precautions / Restrictions Precautions Precautions: Fall Recall of Precautions/Restrictions: Impaired     Mobility  Bed Mobility               General bed mobility comments: in chair on arrival    Transfers Overall transfer level: Needs assistance   Transfers: Sit to/from Stand Sit to Stand: Supervision           General transfer comment: cues for hand placement on chair to rise initially    Ambulation/Gait Ambulation/Gait assistance: Contact guard assist Gait Distance (Feet): 300 Feet Assistive device: Rolling walker (2 wheels), None Gait Pattern/deviations: Step-through pattern, Decreased stride length   Gait velocity  interpretation: 1.31 - 2.62 ft/sec, indicative of limited community ambulator   General Gait Details: pt walked 200' with RW with min cues to avoid obstacles then transition to no AD for additional 100' with good posture and balance with supervision and cues for direction   Stairs             Wheelchair Mobility     Tilt Bed    Modified Rankin (Stroke Patients Only)       Balance Overall balance assessment: Mild deficits observed, not formally tested   Sitting balance-Leahy Scale: Good     Standing balance support: No upper extremity supported Standing balance-Leahy Scale: Good                              Communication Communication Communication: No apparent difficulties  Cognition Arousal: Alert Behavior During Therapy: WFL for tasks assessed/performed   PT - Cognitive impairments: Awareness, Memory, Attention, Sequencing, Problem solving, Safety/Judgement, Orientation                       PT - Cognition Comments: pt not oriented to time Following commands: Impaired Following commands impaired: Follows one step commands with increased time    Cueing Cueing Techniques: Verbal cues  Exercises      General Comments        Pertinent Vitals/Pain Pain Assessment Pain Assessment: No/denies pain    Home Living                          Prior Function  PT Goals (current goals can now be found in the care plan section) Progress towards PT goals: Progressing toward goals    Frequency    Min 2X/week      PT Plan      Co-evaluation              AM-PAC PT 6 Clicks Mobility   Outcome Measure  Help needed turning from your back to your side while in a flat bed without using bedrails?: None Help needed moving from lying on your back to sitting on the side of a flat bed without using bedrails?: A Little Help needed moving to and from a bed to a chair (including a wheelchair)?: A Little Help needed  standing up from a chair using your arms (e.g., wheelchair or bedside chair)?: A Little Help needed to walk in hospital room?: A Little Help needed climbing 3-5 steps with a railing? : A Little 6 Click Score: 19    End of Session Equipment Utilized During Treatment: Gait belt Activity Tolerance: Patient tolerated treatment well Patient left: in chair;with call bell/phone within reach;with chair alarm set;with family/visitor present Nurse Communication: Mobility status PT Visit Diagnosis: Other abnormalities of gait and mobility (R26.89);Muscle weakness (generalized) (M62.81)     Time: 8857-8784 PT Time Calculation (min) (ACUTE ONLY): 33 min  Charges:    $Gait Training: 8-22 mins $Therapeutic Activity: 8-22 mins PT General Charges $$ ACUTE PT VISIT: 1 Visit                     Lenoard SQUIBB, PT Acute Rehabilitation Services Office: [phone]    Lenoard NOVAK Josian Lanese 09/15/2024, 2:04 PM

## 2024-09-15 NOTE — Progress Notes (Signed)
 History Clinton Phillips is a 63 y.o. male who has no significant PMH. He presented to [facility] ED 11/24 with headache that started the night prior around midnight before he went to bed. He didn't wake up for work morning of 11/24 so family checked on him and found him to be altered. He was taken to [facility] where [facility] showed acute SAH with concern for Acomm rupture and small volume IVH with mild ventriculomegaly c/w hydrocephalus. 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 Underwent EVD placement and aneurysmal coiling. Extubated since then. TCDs reassuring.   The night of 11/30 the EVD stopped draining. MD was notified and the EVD was clamped.    12/1 patient got a CT at 7:44. Significant interval improvement of bilateral subarachnoid hemorrhage and No evidence of interval hemorrhage or adverse change found. Neuro exam was WNL with only slight proprioception deficit noted to the left great toe and slight behavioral inhibition present. No seizures overnight.    12/2 No change in the status of the patient. Neuro exam is as below. CT imaging obtained at 0302. No change in hydrocephalus or hemorrhage. Unchanged SAH. Small intraparenchymal hemorrhage noted along the catheter tract. Heparin  administration was restarted.   12/3 No significant events  12/4 CT repeated 0538. Impression is as follows: Stable small volume residual intracranial hemorrhage. Stable ventricle size and configuration. Anterior communicating artery coil pack. No new intracranial abnormality. No other significant events as of 0750   Assessment/Plan: Today the patient is neurologically unchanged with a reassuring exam and imaging. No signs of elevated ICP, CSF leak, infection, or rebleeding.   LOS: 9 days  No changes to POC at this time   Subjective: Patient reports no change in their condition since the prior assessment.  They deny new or worsening symptoms and express no concerns at this time. Pain is adequately  controlled on the current regimen.    Objective: Vital signs in last 24 hours: Temp:  [98 F (36.7 C)-99 F (37.2 C)] 98.8 F (37.1 C) (12/04 1142) Pulse Rate:  [47-73] 49 (12/04 0700) Resp:  [16-25] 17 (12/04 0700) BP: (140-180)/(67-96) 145/77 (12/04 0700) SpO2:  [94 %-98 %] 97 % (12/04 0700)  Intake/Output from previous day: 12/03 0701 - 12/04 0700 In: 2832.8 [P.O.:1070; I.V.:1762.8] Out: 2425 [Urine:2425] Intake/Output this shift: Total I/O In: 128 [P.O.:118; I.V.:10] Out: 425 [Urine:425]  PHYSICAL EXAM  HENT:     Head: Normocephalic.     Nose: Nose normal.  Eyes:     Pupils: Pupils are equal, round, and reactive to light.  Cardiovascular:     Rate and Rhythm: Normal rate.  Pulmonary:     Effort: Pulmonary effort is normal.  Abdominal:     General: Abdomen is flat.  Musculoskeletal:     Cervical back: Normal range of motion.  Neurological:     Mental Status: Patient is alert and oriented.        Cranial Nerves: Cranial nerves 2-12 are intact.     Sensory: Sensation is intact.     Motor: Motor function is intact.    Coordination: Coordination is intact.  Skin:      Former EVD insertion site appears clean, dry, and intact with well-approximated sutures. sutures are intact without erythema, drainage, or dehiscence.    Lab Results: Recent Labs    09/13/24 0222  WBC 7.8  HGB 13.0  HCT 36.9*  PLT 239   BMET Recent Labs    09/14/24 0218 09/14/24 0904 09/15/24 0513 09/15/24  0831  NA 134*   < > 136 135  K 4.5  --  4.0  --   CL 102  --  107  --   CO2 23  --  21*  --   GLUCOSE 130*  --  124*  --   BUN 15  --  15  --   CREATININE 1.02  --  0.93  --   CALCIUM 8.8*  --  8.8*  --    < > = values in this interval not displayed.    Studies/Results: VAS US  TRANSCRANIAL DOPPLER Result Date: 09/15/2024  Transcranial Doppler Patient Name:  Clinton Phillips  Date of Exam:   09/14/2024 Medical Rec #: [id number]        Accession #:    [id number] Date of Birth:  Oct 30, 1960        Patient Gender: M Patient Age:   47 years Exam Location:  [facility] Procedure:      VAS US  TRANSCRANIAL DOPPLER Referring Phys: SAMMI GORE --------------------------------------------------------------------------------  Indications: Subarachnoid hemorrhage. Performing Technologist: Ricka Sturdivant-Jones RDMS, RVT  Examination Guidelines: A complete evaluation includes B-mode imaging, spectral Doppler, color Doppler, and power Doppler as needed of all accessible portions of each vessel. Bilateral testing is considered an integral part of a complete examination. Limited examinations for reoccurring indications may be performed as noted.  +----------+---------------+----------+-----------+-------+ RIGHT TCD Right VM (cm/s)Depth (cm)PulsatilityComment +----------+---------------+----------+-----------+-------+ MCA             72                    1.07            +----------+---------------+----------+-----------+-------+ ACA             -46                   1.19            +----------+---------------+----------+-----------+-------+ Term ICA        38                    1.24            +----------+---------------+----------+-----------+-------+ PCA P1          23                    0.95            +----------+---------------+----------+-----------+-------+ Opthalmic       23                    1.33            +----------+---------------+----------+-----------+-------+ ICA siphon      37                    1.14            +----------+---------------+----------+-----------+-------+ Vertebral       -30                   1.24            +----------+---------------+----------+-----------+-------+ Distal ICA      31                    1.11            +----------+---------------+----------+-----------+-------+  +----------+--------------+----------+-----------+-------+ LEFT TCD  Left VM (cm/s)Depth (cm)PulsatilityComment  +----------+--------------+----------+-----------+-------+ MCA  61                   1.23            +----------+--------------+----------+-----------+-------+ ACA            -32                   1.03            +----------+--------------+----------+-----------+-------+ Term ICA        34                   0.95            +----------+--------------+----------+-----------+-------+ PCA P1          34                   1.05            +----------+--------------+----------+-----------+-------+ Opthalmic       26                   1.29            +----------+--------------+----------+-----------+-------+ ICA siphon      45                   1.09            +----------+--------------+----------+-----------+-------+ Vertebral      -30                   0.95            +----------+--------------+----------+-----------+-------+ Distal ICA      32                   0.99            +----------+--------------+----------+-----------+-------+  +------------+-------+-------+             VM cm/sComment +------------+-------+-------+ Prox Basilar  -43          +------------+-------+-------+ Dist Basilar  -28          +------------+-------+-------+ +----------------------+---+ Right Lindegaard Ratio2.3 +----------------------+---+ +---------------------+---+ Left Lindegaard Ratio1.9 +---------------------+---+  Summary: This was a normal transcranial Doppler study, with normal flow direction and velocity of all identified vessels of the anterior and posterior circulations, with no evidence of stenosis, vasospasm or occlusion. There was no evidence of intracranial disease. *See table(s) above for TCD measurements and observations.  Diagnosing physician: Eather Popp MD Electronically signed by Eather Popp MD on 09/15/2024 at 11:51:20 AM.    Final    CT HEAD WO CONTRAST ( ) Result Date: 09/15/2024 EXAM: CT HEAD WITHOUT CONTRAST 09/15/2024 05:38:39 AM  TECHNIQUE: CT of the head was performed without the administration of intravenous contrast. Automated exposure control, iterative reconstruction, and/or weight based adjustment of the mA/kV was utilized to reduce the radiation dose to as low as reasonably achievable. COMPARISON: Head CT 09/13/2024 and earlier. 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 Coil embolized anterior communicating artery aneurysm. FINDINGS: BRAIN AND VENTRICLES: Anterior communicating artery metal embolization coils are noted and appear stable. Stable gray-white differentiation. Stable left paracentral posterior fossa probable arachnoid cyst. Stable small volume residual intracranial hemorrhage, including at the septum pellucidum, along previous right EVD shunt tract, within the lateral ventricles, and Small volume bilateral subarachnoid blood. Ventricle size is stable, improved compared to presentation head CT. No transependymal edema. No evidence of acute infarct. No intracranial mass effect or midline shift. Calcified atherosclerosis  at the skull base. ORBITS: No acute abnormality. SINUSES: Paranasal sinuses, tympanic cavities and mastoids well aerated. SOFT TISSUES AND SKULL: Right superior frontal bone burr hole again noted with stable overlying postoperative changes to the scalp. No acute orbital scalp soft tissue finding. No acute osseous abnormality. No skull fracture. IMPRESSION: 1. Stable small volume residual intracranial hemorrhage. Stable ventricle size and configuration. Anterior communicating artery coil pack. 2. No new intracranial abnormality. Electronically signed by: Helayne Hurst MD 09/15/2024 05:51 AM EST RP Workstation: [id number]   VAS US  TRANSCRANIAL DOPPLER Result Date: 09/14/2024  Transcranial Doppler Patient Name:  Clinton Phillips  Date of Exam:   09/13/2024 Medical Rec #: [id number]        Accession #:    [id number] Date of Birth: 1961-06-20        Patient Gender: M  Patient Age:   69 years Exam [facility] Procedure:      VAS US  TRANSCRANIAL DOPPLER Referring Phys: DINO SABLE --------------------------------------------------------------------------------  Indications: Subarachnoid hemorrhage. Limitations: VERY difficult study, patient uncooperative and continuously moving              and talking despite several attempts at redirection. Comparison Study: Previous exam on 09/12/2024 Performing Technologist: Ezzie Potters RVT, RDMS  Examination Guidelines: A complete evaluation includes B-mode imaging, spectral Doppler, color Doppler, and power Doppler as needed of all accessible portions of each vessel. Bilateral testing is considered an integral part of a complete examination. Limited examinations for reoccurring indications may be performed as noted.  +----------+---------------+----------+-----------+------------------+ RIGHT TCD Right VM (cm/s)Depth (cm)Pulsatility     Comment       +----------+---------------+----------+-----------+------------------+ MCA             58                    1.06                       +----------+---------------+----------+-----------+------------------+ ACA             -17                   1.01                       +----------+---------------+----------+-----------+------------------+ Term ICA        40                    1.30                       +----------+---------------+----------+-----------+------------------+ PCA P1          21                    1.23                       +----------+---------------+----------+-----------+------------------+ Opthalmic       22                    1.67                       +----------+---------------+----------+-----------+------------------+ ICA siphon                                    unable to insonate +----------+---------------+----------+-----------+------------------+ Vertebral       -  26                   1.76                        +----------+---------------+----------+-----------+------------------+ Distal ICA      25                    1.13                       +----------+---------------+----------+-----------+------------------+  +----------+--------------+----------+-----------+------------------+ LEFT TCD  Left VM (cm/s)Depth (cm)Pulsatility     Comment       +----------+--------------+----------+-----------+------------------+ MCA             66                   1.33                       +----------+--------------+----------+-----------+------------------+ ACA                                          unable to insonate +----------+--------------+----------+-----------+------------------+ Term ICA        41                   1.40                       +----------+--------------+----------+-----------+------------------+ PCA P1          26                   1.24                       +----------+--------------+----------+-----------+------------------+ Opthalmic       22                   1.75                       +----------+--------------+----------+-----------+------------------+ ICA siphon                                   unable to insonate +----------+--------------+----------+-----------+------------------+ Vertebral      -35                   1.31                       +----------+--------------+----------+-----------+------------------+ Distal ICA      23                   1.34                       +----------+--------------+----------+-----------+------------------+  +------------+-------+------------------+             VM cm/s     Comment       +------------+-------+------------------+ Prox Basilar  -40         1.26        +------------+-------+------------------+ Dist Basilar       unable to insonate +------------+-------+------------------+ +----------------------+----+ Right Lindegaard Ratio2.32 +----------------------+----+  +---------------------+----+ Left Lindegaard Ratio2.87 +---------------------+----+  Summary: This was a normal transcranial Doppler study, with normal flow direction and velocity  of all identified vessels of the anterior and posterior circulations, with no evidence of stenosis, vasospasm or occlusion. There was no evidence of intracranial disease. *See table(s) above for TCD measurements and observations.  Diagnosing physician: Eather Popp MD Electronically signed by Eather Popp MD on 09/14/2024 at 11:24:36 AM.    Final    US  EKG SITE RITE Result Date: 09/13/2024 If Site Rite image not attached, placement could not be confirmed due to current cardiac rhythm.   Clinton Phillips 09/15/2024, 12:26 PM

## 2024-09-16 ENCOUNTER — Inpatient Hospital Stay (HOSPITAL_COMMUNITY)

## 2024-09-16 DIAGNOSIS — E871 Hypo-osmolality and hyponatremia: Secondary | ICD-10-CM | POA: Diagnosis not present

## 2024-09-16 DIAGNOSIS — I609 Nontraumatic subarachnoid hemorrhage, unspecified: Secondary | ICD-10-CM

## 2024-09-16 DIAGNOSIS — G911 Obstructive hydrocephalus: Secondary | ICD-10-CM

## 2024-09-16 DIAGNOSIS — I10 Essential (primary) hypertension: Secondary | ICD-10-CM | POA: Insufficient documentation

## 2024-09-16 LAB — SODIUM
Sodium: 139 mmol/L (ref 135–145)
Sodium: 137 mmol/L (ref 135–145)
Sodium: 137 mmol/L (ref 135–145)

## 2024-09-16 LAB — BASIC METABOLIC PANEL WITH GFR
Anion gap: 13 (ref 5–15)
BUN: 14 mg/dL (ref 8–23)
CO2: 24 mmol/L (ref 22–32)
Calcium: 9.2 mg/dL (ref 8.9–10.3)
Chloride: 101 mmol/L (ref 98–111)
Creatinine, Ser: 0.98 mg/dL (ref 0.61–1.24)
GFR, Estimated: >60 mL/min (ref >60)
Glucose, Bld: 116 mg/dL — ABNORMAL HIGH (ref 70–99)
Potassium: 3.7 mmol/L (ref 3.5–5.1)
Sodium: 138 mmol/L (ref 135–145)

## 2024-09-16 LAB — MAGNESIUM: Magnesium: 2.3 mg/dL (ref 1.7–2.4)

## 2024-09-16 NOTE — Progress Notes (Signed)
 Inpatient Rehab Admissions Coordinator:    CIR following. Pt. Is not medically ready currently; however, note PT is now recommending outpatient therapies. I would like to see how he does with OT, but CIR may sign off if Pt. Demonstrates good progress.  Leita Kleine, MS, CCC-SLP Rehab Admissions Coordinator  [phone] (celll) [phone] (office)

## 2024-09-16 NOTE — Assessment & Plan Note (Signed)
-   BP goal systolic 130-1 50s -Continue nimodipine  x 21 days  - losartan  started in ICU as well

## 2024-09-16 NOTE — Progress Notes (Signed)
 Progress Note    Clinton Phillips   FMW:[id number]  DOB: 1960-12-08  DOA: 09/05/2024     11 PCP: Pcp, No  Initial CC: AMS  Hospital Course: Mr. Ditullio is a 63 year old male with no significant PMH who originally presented to [facility] ER on 11/24 with a headache.  He was awoken the following morning by family and found to have altered mentation. He was taken to [facility] where [facility] showed acute SAH with concern for Acomm rupture and small volume IVH with mild ventriculomegaly c/w hydrocephalus. 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  Underwent EVD placement and aneurysmal coiling. Extubated since then. TCDs reassuring.  Significant Hospital Events:  11/24 admit, underwent EVD placement and aneurysmal coiling 09/06/24 Extubated  09/08/24 low-grade fever 100.2 09/13/24 EVD removed  12/3 started on hypertonic saline at 75 cc/h, checking serum sodium every 6 hours. Repeat head CT showing stable slight hydrocephalus   He completed 7-day course of Keppra  for seizure prophylaxis while in the ICU.  He also progressed well with physical therapy.  He was transferred to [facility] service on 09/16/2024.  Interval History:  Patient sitting in recliner this morning.  Waist belt in place due to some impulsivity.  Has been ambulating well with physical therapy.  Assessment and Plan: * SAH (subarachnoid hemorrhage) (HCC) - s/p EVD and coiling of aneurysm.  TCD's have been reassuring per neurosurgery -Has undergone serial head imaging.  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  Discontinued on 12/4 - remains on salt tabs - Continue daily BMP while here  HTN  (hypertension) - BP goal systolic 130-1 50s -Continue nimodipine  x 21 days  - losartan  started in ICU as well    Antimicrobials:   DVT prophylaxis:  heparin  injection 5,000 Units Start: 09/06/24 1400 SCD's Start: 09/05/24 1338   Code Status:   Code Status: Full Code  Mobility Assessment (Last 72 Hours)     Mobility Assessment     Row Name 09/16/24 0800 09/15/24 2000 09/15/24 1446 09/15/24 1403 09/14/24 1930   Does the patient have exclusion criteria? No- Perform mobility assessment No- Perform mobility assessment No- Perform mobility assessment -- No- Perform mobility assessment   What is the highest level of mobility based on the mobility assessment? Level 4 (Ambulates with assistance) - Balance while stepping forward/back - Complete Level 4 (Ambulates with assistance) - Balance while stepping forward/back - Complete Level 4 (Ambulates with assistance) - Balance while stepping forward/back - Complete Level 4 (Ambulates with assistance) - Balance while stepping forward/back - Complete Level 3 (Stands with assistance) - Balance while standing  and cannot march in place   Is the above level different from baseline mobility prior to current illness? -- Yes - Recommend PT order -- -- Yes - Recommend PT order    Row Name 09/14/24 1300 09/14/24 1200 09/13/24 2000       Does the patient have exclusion criteria? -- -- No- Perform mobility assessment     What is the highest level of mobility based on the mobility assessment? Level 3 (Stands with assistance) - Balance while standing  and cannot march in place Level 3 (Stands with assistance) - Balance while standing  and cannot march in place Level 3 (Stands with assistance) - Balance while standing  and cannot march in place     Is the above level different from baseline mobility prior to current illness? -- -- Yes - Recommend PT order        Diet: Diet Orders (From admission, onward)     Start     Ordered   09/07/24 1128  Diet regular  Room service appropriate? Yes with Assist; Fluid consistency: Thin  Diet effective now       Question Answer Comment  Room service appropriate? Yes with Assist   Fluid consistency: Thin      09/07/24 1127            Barriers to discharge: None Disposition Plan: TBD.  Rehab versus home HH orders placed: TBD Status is: Inpatient  Objective: Blood pressure (!) 166/93, pulse 60, temperature 99 F (37.2 C), temperature source Oral, resp. rate 19, height 6' 1 (1.854 m), weight 104 kg, SpO2 97%.  Examination:  Physical Exam Constitutional:      General: He is not in acute distress.    Appearance: Normal appearance.  HENT:     Head: Normocephalic and atraumatic.     Comments: Healing anterior scalp incision from prior EVD     Mouth/Throat:     Mouth: Mucous membranes are moist.  Eyes:     Extraocular Movements: Extraocular movements intact.  Cardiovascular:     Rate and Rhythm: Normal rate and regular rhythm.  Pulmonary:     Effort: Pulmonary effort is normal. No respiratory distress.     Breath sounds: Normal breath sounds. No wheezing.  Abdominal:     General: Bowel sounds are normal. There is no distension.     Palpations: Abdomen is soft.     Tenderness: There is no abdominal tenderness.  Musculoskeletal:        General: Normal range of motion.     Cervical back: Normal range of motion and neck supple.  Skin:    General: Skin is warm and dry.  Neurological:     General: No focal deficit present.     Mental Status: He is alert.     Sensory: No sensory deficit.     Comments: Slightly slowed mentation but alert; no focal deficits   Psychiatric:        Mood and Affect: Mood normal.        Behavior: Behavior normal.      Consultants:  PCCM Neurosurgery  Data Reviewed: Results for orders placed or performed during the hospital encounter of 09/05/24 (from the past 24 hours)  Sodium     Status: None   Collection Time: 09/15/24  2:22 PM  Result Value Ref Range    Sodium 140 135 - 145 mmol/L  Sodium     Status: None   Collection Time: 09/15/24  7:20 PM  Result Value Ref Range   Sodium 139 135 - 145 mmol/L  Sodium     Status: None   Collection Time: 09/16/24  1:20 AM  Result Value Ref Range   Sodium 139 135 - 145 mmol/L  Basic metabolic panel     Status: Abnormal   Collection Time: 09/16/24  4:21 AM  Result Value Ref Range   Sodium 138 135 - 145 mmol/L   Potassium 3.7 3.5 - 5.1 mmol/L   Chloride 101 98 - 111 mmol/L   CO2 24 22 - 32 mmol/L   Glucose, Bld 116 (H) 70 -  99 mg/dL   BUN 14 8 - 23 mg/dL   Creatinine, Ser 9.01 0.61 - 1.24 mg/dL   Calcium 9.2 8.9 - 89.6 mg/dL   GFR, Estimated >39 >39 mL/min   Anion gap 13 5 - 15  Magnesium     Status: None   Collection Time: 09/16/24  4:21 AM  Result Value Ref Range   Magnesium 2.3 1.7 - 2.4 mg/dL  Sodium     Status: None   Collection Time: 09/16/24  8:00 AM  Result Value Ref Range   Sodium 137 135 - 145 mmol/L    I have reviewed pertinent nursing notes, vitals, labs, and images as necessary. I have ordered labwork to follow up on as indicated.  I have reviewed the last notes from staff over past 24 hours. I have discussed patient's care plan and test results with nursing staff, CM/SW, and other staff as appropriate.  Old records reviewed in assessment of this patient  Time spent: Greater than 50% of the 55 minute visit was spent in counseling/coordination of care for the patient as laid out in the A&P.   LOS: 11 days   Alm Apo, MD [facility] 09/16/2024, 1:27 PM

## 2024-09-16 NOTE — Progress Notes (Signed)
   09/16/24 1849  Vitals  Temp 98 F (36.7 C)  Temp Source Oral  BP (!) 142/86  MAP (mmHg) 102  BP Location Left Arm  BP Method Automatic  Patient Position (if appropriate) Lying  Pulse Rate 66  Pulse Rate Source Monitor  Resp 15  Level of Consciousness  Level of Consciousness Alert  MEWS COLOR  MEWS Score Color Green  Oxygen Therapy  SpO2 97 %  O2 Device Room Air  Pain Assessment  Pain Scale 0-10  Pain Score 2  Pain Location Other (Comment) (Tailbone)  Pain Intervention(s) Repositioned;Rest  PCA/Epidural/Spinal Assessment  Respiratory Pattern Regular;Unlabored  Glasgow Coma Scale  Eye Opening 4  Best Verbal Response (NON-intubated) 4  Best Motor Response 6  Glasgow Coma Scale Score 14  MEWS Score  MEWS Temp 0  MEWS Systolic 0  MEWS Pulse 0  MEWS RR 0  MEWS LOC 0  MEWS Score 0   Patient transfer from ICU to 4NP-10. A&O X3 (disoriented to time) with 2/10 tailbone pain and minor headache. Patient repositioned. Family at bedside and personal belongings with patient. Vitals as listed and assessment completed. Patient oriented to unit and staff. Cardiac tele applied, call bell within reach, and bed alarm on.

## 2024-09-16 NOTE — Assessment & Plan Note (Addendum)
-   presumed SIADH - S/p hypertonic saline in the ICU.  Discontinued on 12/4 - remains on salt tabs; sodium improving.  Continued on 5 more days at discharge

## 2024-09-16 NOTE — Assessment & Plan Note (Addendum)
-   s/p EVD and coiling of aneurysm.  TCD's have been reassuring per neurosurgery -Has undergone serial head imaging.  CT head from 12/4 shows stable small volume residual ICH and stable ventricular size with anterior communicating artery coil pack in place -Remains neurologically stable and working well with PT and OT - Plan is for outpatient PT/OT

## 2024-09-16 NOTE — Progress Notes (Signed)
 Occupational Therapy Treatment Patient Details Name: Clinton Phillips MRN: [id number] DOB: 1960-10-27 Today's Date: 09/16/2024   History of present illness 63 yo male adm 11/24 with HA, AMS, acute SAH with concern for Acomm rupture and small volume IVH with mild ventriculomegaly c/w hydrocephalus. 11/24 EVD and coil embolization. ETT 11/24-11/25. 12/1 EVD removed. PMHx: none.   OT comments  Pt is making great progress towards their acute OT goals. He continues to have impairments in cognition, balance and safety awareness. He needed cues for problem solving and carried out 2 step directions about 50% of the time. He initiated and sequenced ADLs (toileting, hygiene, grooming) WFL. He does demonstrate posterior bias and needed min A 3x to correct balance. OT to continue to follow acutely to facilitate progress towards established goals. Pt has good home support and will benefit from OP OT.        If plan is discharge home, recommend the following:  A lot of help with walking and/or transfers;A lot of help with bathing/dressing/bathroom;Assistance with cooking/housework;Assist for transportation   Equipment Recommendations  Other (comment)    Recommendations for Other Services Rehab consult    Precautions / Restrictions Precautions Precautions: Fall Recall of Precautions/Restrictions: Impaired Restrictions Weight Bearing Restrictions Per Provider Order: No       Mobility Bed Mobility Overal bed mobility: Needs Assistance Bed Mobility: Supine to Sit     Supine to sit: Contact guard     General bed mobility comments: increased time and ecouragement needed    Transfers Overall transfer level: Needs assistance   Transfers: Sit to/from Stand Sit to Stand: Min assist           General transfer comment: cue needed, assist for posterior bias     Balance Overall balance assessment: Mild deficits observed, not formally tested Sitting-balance support: Feet supported Sitting  balance-Leahy Scale: Good     Standing balance support: No upper extremity supported, During functional activity Standing balance-Leahy Scale: Fair Standing balance comment: posterior bias                           ADL either performed or assessed with clinical judgement   ADL Overall ADL's : Needs assistance/impaired     Grooming: Wash/dry hands;Standing;Minimal assistance Grooming Details (indicate cue type and reason): min A for cues and posterior bias                 Toilet Transfer: Minimal assistance Toilet Transfer Details (indicate cue type and reason): assist for cues and balance at times         Functional mobility during ADLs: Minimal assistance General ADL Comments: continues to be limited by cognition, balance and safety awareness    Extremity/Trunk Assessment Upper Extremity Assessment Upper Extremity Assessment: Overall WFL for tasks assessed   Lower Extremity Assessment Lower Extremity Assessment: Defer to PT evaluation        Vision   Vision Assessment?: Wears glasses for reading   Perception Perception Perception: Within Functional Limits   Praxis Praxis Praxis: [facility]   Communication Communication Communication: No apparent difficulties   Cognition Arousal: Alert Behavior During Therapy: WFL for tasks assessed/performed Cognition: Cognition impaired   Orientation impairments: Situation, Time Awareness: Intellectual awareness intact, Online awareness impaired Memory impairment (select all impairments): Short-term memory, Working civil service fast streamer, Engineer, structural memory Attention impairment (select first level of impairment): Sustained attention Executive functioning impairment (select all impairments): Initiation, Organization, Sequencing, Reasoning, Problem solving OT - Cognition Comments: continues to  joke, requires cues and encouragement to participate. Perseverates on stiffness, poor insight into situation and safety. Sequences  through ADLs well. Follows 2 step commands ~50% of the time                 Following commands: Impaired        Cueing   Cueing Techniques: Verbal cues        General Comments VSS on RA, loose BM    Pertinent Vitals/ Pain       Pain Assessment Pain Assessment: Faces Faces Pain Scale: Hurts little more Pain Location: low back Pain Descriptors / Indicators: Discomfort, Grimacing, Guarding, Cramping Pain Intervention(s): Limited activity within patient's tolerance, Monitored during session   Frequency  Min 2X/week        Progress Toward Goals  OT Goals(current goals can now be found in the care plan section)  Progress towards OT goals: Progressing toward goals  Acute Rehab OT Goals Patient Stated Goal: to get better OT Goal Formulation: With patient Time For Goal Achievement: 09/20/24 Potential to Achieve Goals: Good ADL Goals Pt Will Perform Grooming: with supervision;standing Pt Will Perform Upper Body Dressing: with supervision Pt Will Perform Lower Body Dressing: with supervision;sit to/from stand Pt Will Transfer to Toilet: with supervision;ambulating Additional ADL Goal #1: Pt will indep sequence through a basic ADL task   AM-PAC OT 6 Clicks Daily Activity     Outcome Measure   Help from another person eating meals?: A Little Help from another person taking care of personal grooming?: A Little Help from another person toileting, which includes using toliet, bedpan, or urinal?: A Little Help from another person bathing (including washing, rinsing, drying)?: A Little Help from another person to put on and taking off regular upper body clothing?: A Little Help from another person to put on and taking off regular lower body clothing?: A Little 6 Click Score: 18    End of Session Equipment Utilized During Treatment: Gait belt  OT Visit Diagnosis: Unsteadiness on feet (R26.81);Other abnormalities of gait and mobility (R26.89);Muscle weakness  (generalized) (M62.81);Pain   Activity Tolerance Patient tolerated treatment well   Patient Left     Nurse Communication          Time: 8563-8497 OT Time Calculation (min): 26 min  Charges: OT General Charges $OT Visit: 1 Visit OT Treatments $Self Care/Home Management : 23-37 mins  Lucie Kendall, OTR/L Acute Rehabilitation Services Office [phone] Secure Chat Communication Preferred   [name] 09/16/2024, 3:55 PM

## 2024-09-16 NOTE — Progress Notes (Signed)
 History  Clinton Phillips is a 64 y.o. male who has no significant PMH. He presented to [facility] ED 11/24 with headache that started the night prior around midnight before he went to bed. He didn't wake up for work morning of 11/24 so family checked on him and found him to be altered. He was taken to [facility] [address] where [facility] showed acute SAH with concern for Acomm rupture and small volume IVH with mild ventriculomegaly c/w hydrocephalus. 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 Underwent EVD placement and aneurysmal coiling. Extubated since then. TCDs reassuring.   The night of 11/30 the EVD stopped draining. MD was notified and the EVD was clamped.    12/1 patient got a CT at 7:44. Significant interval improvement of bilateral subarachnoid hemorrhage and No evidence of interval hemorrhage or adverse change found. Neuro exam was WNL with only slight proprioception deficit noted to the left great toe and slight behavioral inhibition present. No seizures overnight.    12/2 No change in the status of the patient. Neuro exam is as below. CT imaging obtained at 0302. No change in hydrocephalus or hemorrhage. Unchanged SAH. Small intraparenchymal hemorrhage noted along the catheter tract. Heparin  administration was restarted.   12/3 No significant events   12/4 CT repeated 0538. Impression is as follows: Stable small volume residual intracranial hemorrhage. Stable ventricle size and configuration. Anterior communicating artery coil pack. No new intracranial abnormality. No other significant events as of 0750  12/5 @0730  no significant events last night   Assessment/Plan: No changes in the patients condition today. The patient demonstrates appropriate postoperative recovery with an intact neurological exam and no new deficits. Site is clean and dry, pain is adequately managed with Tylenol , and there are no signs of infection or complication at this time.   LOS: 1 day  No change in plan today    Subjective: Patient denies any concern or changes in condition at this time. He reports that his headache is gone though his back is a little sore from the bed. He is eager to walk around the unit and go home.   Objective: Vital signs in last 24 hours: Temp:  [97.4 F (36.3 C)-99.1 F (37.3 C)] 97.6 F (36.4 C) (12/05 0800) Pulse Rate:  [51-169] 63 (12/05 0700) Resp:  [16-23] 19 (12/05 0700) BP: (125-176)/(63-108) 158/95 (12/05 0700) SpO2:  [87 %-100 %] 100 % (12/05 0700) Weight:  [104 kg] 104 kg (12/04 1443)  Intake/Output from previous day: 12/04 0701 - 12/05 0700 In: 138 [P.O.:118; I.V.:20] Out: 1310 [Urine:1310] Intake/Output this shift: Total I/O In: -  Out: 175 [Urine:175]  PHYSICAL EXAM  HENT:     Head: Normocephalic.     Nose: Nose normal.  Eyes:     Pupils: Pupils are equal, round, and reactive to light.  Cardiovascular:     Rate and Rhythm: Normal rate.  Pulmonary:     Effort: Pulmonary effort is normal.  Abdominal:     General: Abdomen is flat.  Musculoskeletal:     Cervical back: Normal range of motion.  Neurological:     Mental Status: Patient is alert and oriented.        Cranial Nerves: Cranial nerves 2-12 are intact.     Sensory: Sensation is intact.     Motor: Motor function is intact.    Coordination: Coordination is intact.  Skin:     Skin is intact      Former EVD insertion site appears clean, dry, and intact  with well-approximated sutures.  Cranial incision sutures are intact without erythema, drainage, or dehiscence.    Lab Results: No results for input(s): WBC, HGB, HCT, PLT in the last 72 hours. BMET Recent Labs    09/15/24 0513 09/15/24 0831 09/16/24 0421 09/16/24 0800  NA 136   < > 138 137  K 4.0  --  3.7  --   CL 107  --  101  --   CO2 21*  --  24  --   GLUCOSE 124*  --  116*  --   BUN 15  --  14  --   CREATININE 0.93  --  0.98  --   CALCIUM 8.8*  --  9.2  --    < > = values in this interval not displayed.     Studies/Results: VAS US  TRANSCRANIAL DOPPLER Result Date: 09/15/2024  Transcranial Doppler Patient Name:  Clinton Phillips  Date of Exam:   09/15/2024 Medical Rec #: [id number]        Accession #:    [id number] Date of Birth: 06-Mar-1961        Patient Gender: M Patient Age:   66 years Exam Location:  [facility] Procedure:      VAS US  TRANSCRANIAL DOPPLER Referring Phys: SAMMI GORE --------------------------------------------------------------------------------  Indications: Subarachnoid hemorrhage. Comparison Study: 09/12/24 TCD Performing Technologist: Ezzie Potters RVT, RDMS  Examination Guidelines: A complete evaluation includes B-mode imaging, spectral Doppler, color Doppler, and power Doppler as needed of all accessible portions of each vessel. Bilateral testing is considered an integral part of a complete examination. Limited examinations for reoccurring indications may be performed as noted.  +----------+---------------+----------+-----------+------------------+ RIGHT TCD Right VM (cm/s)Depth (cm)Pulsatility     Comment       +----------+---------------+----------+-----------+------------------+ MCA             84                    0.78                       +----------+---------------+----------+-----------+------------------+ ACA                                           unable to insonate +----------+---------------+----------+-----------+------------------+ Term ICA        38                     1.0                       +----------+---------------+----------+-----------+------------------+ PCA P1          20                    0.97                       +----------+---------------+----------+-----------+------------------+ Opthalmic       17                    1.41                       +----------+---------------+----------+-----------+------------------+ ICA siphon      41                    0.90                        +----------+---------------+----------+-----------+------------------+  Vertebral       -26                   1.13                       +----------+---------------+----------+-----------+------------------+ Distal ICA      -33                                              +----------+---------------+----------+-----------+------------------+  +----------+--------------+----------+-----------+------------------+ LEFT TCD  Left VM (cm/s)Depth (cm)Pulsatility     Comment       +----------+--------------+----------+-----------+------------------+ MCA             93                   0.91                       +----------+--------------+----------+-----------+------------------+ ACA                                          unable to insonate +----------+--------------+----------+-----------+------------------+ Term ICA        43                   0.75                       +----------+--------------+----------+-----------+------------------+ PCA P1          44                   0.87                       +----------+--------------+----------+-----------+------------------+ Opthalmic       20                   1.38                       +----------+--------------+----------+-----------+------------------+ ICA siphon                                   unable to insonate +----------+--------------+----------+-----------+------------------+ Vertebral      -33                   0.93                       +----------+--------------+----------+-----------+------------------+ Distal ICA     -31                                              +----------+--------------+----------+-----------+------------------+  +------------+-------+-------+             VM cm/sComment +------------+-------+-------+ Prox Basilar  -28          +------------+-------+-------+ +----------------------+----+ Right Lindegaard Ratio2.05 +----------------------+----+  +---------------------+---+ Left Lindegaard Ratio3.0 +---------------------+---+    Preliminary    VAS US  TRANSCRANIAL DOPPLER Result Date: 09/15/2024  Transcranial Doppler Patient Name:  ELLERY Broder  Date of Exam:   09/14/2024 Medical Rec #: [id number]  Accession #:    [id number] Date of Birth: 07-21-1961        Patient Gender: M Patient Age:   30 years Exam Location:  [facility] Procedure:      VAS US  TRANSCRANIAL DOPPLER Referring Phys: SAMMI GORE --------------------------------------------------------------------------------  Indications: Subarachnoid hemorrhage. Performing Technologist: Ricka Sturdivant-Jones RDMS, RVT  Examination Guidelines: A complete evaluation includes B-mode imaging, spectral Doppler, color Doppler, and power Doppler as needed of all accessible portions of each vessel. Bilateral testing is considered an integral part of a complete examination. Limited examinations for reoccurring indications may be performed as noted.  +----------+---------------+----------+-----------+-------+ RIGHT TCD Right VM (cm/s)Depth (cm)PulsatilityComment +----------+---------------+----------+-----------+-------+ MCA             72                    1.07            +----------+---------------+----------+-----------+-------+ ACA             -46                   1.19            +----------+---------------+----------+-----------+-------+ Term ICA        38                    1.24            +----------+---------------+----------+-----------+-------+ PCA P1          23                    0.95            +----------+---------------+----------+-----------+-------+ Opthalmic       23                    1.33            +----------+---------------+----------+-----------+-------+ ICA siphon      37                    1.14            +----------+---------------+----------+-----------+-------+ Vertebral       -30                   1.24             +----------+---------------+----------+-----------+-------+ Distal ICA      31                    1.11            +----------+---------------+----------+-----------+-------+  +----------+--------------+----------+-----------+-------+ LEFT TCD  Left VM (cm/s)Depth (cm)PulsatilityComment +----------+--------------+----------+-----------+-------+ MCA             61                   1.23            +----------+--------------+----------+-----------+-------+ ACA            -32                   1.03            +----------+--------------+----------+-----------+-------+ Term ICA        34                   0.95            +----------+--------------+----------+-----------+-------+ PCA P1  34                   1.05            +----------+--------------+----------+-----------+-------+ Opthalmic       26                   1.29            +----------+--------------+----------+-----------+-------+ ICA siphon      45                   1.09            +----------+--------------+----------+-----------+-------+ Vertebral      -30                   0.95            +----------+--------------+----------+-----------+-------+ Distal ICA      32                   0.99            +----------+--------------+----------+-----------+-------+  +------------+-------+-------+             VM cm/sComment +------------+-------+-------+ Prox Basilar  -43          +------------+-------+-------+ Dist Basilar  -28          +------------+-------+-------+ +----------------------+---+ Right Lindegaard Ratio2.3 +----------------------+---+ +---------------------+---+ Left Lindegaard Ratio1.9 +---------------------+---+  Summary: This was a normal transcranial Doppler study, with normal flow direction and velocity of all identified vessels of the anterior and posterior circulations, with no evidence of stenosis, vasospasm or occlusion. There was no evidence of intracranial  disease. *See table(s) above for TCD measurements and observations.  Diagnosing physician: Eather Popp MD Electronically signed by Eather Popp MD on 09/15/2024 at 11:51:20 AM.    Final    CT HEAD WO CONTRAST ( ) Result Date: 09/15/2024 EXAM: CT HEAD WITHOUT CONTRAST 09/15/2024 05:38:39 AM TECHNIQUE: CT of the head was performed without the administration of intravenous contrast. Automated exposure control, iterative reconstruction, and/or weight based adjustment of the mA/kV was utilized to reduce the radiation dose to as low as reasonably achievable. COMPARISON: Head CT 09/13/2024 and earlier. 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 Coil embolized anterior communicating artery aneurysm. FINDINGS: BRAIN AND VENTRICLES: Anterior communicating artery metal embolization coils are noted and appear stable. Stable gray-white differentiation. Stable left paracentral posterior fossa probable arachnoid cyst. Stable small volume residual intracranial hemorrhage, including at the septum pellucidum, along previous right EVD shunt tract, within the lateral ventricles, and Small volume bilateral subarachnoid blood. Ventricle size is stable, improved compared to presentation head CT. No transependymal edema. No evidence of acute infarct. No intracranial mass effect or midline shift. Calcified atherosclerosis at the skull base. ORBITS: No acute abnormality. SINUSES: Paranasal sinuses, tympanic cavities and mastoids well aerated. SOFT TISSUES AND SKULL: Right superior frontal bone burr hole again noted with stable overlying postoperative changes to the scalp. No acute orbital scalp soft tissue finding. No acute osseous abnormality. No skull fracture. IMPRESSION: 1. Stable small volume residual intracranial hemorrhage. Stable ventricle size and configuration. Anterior communicating artery coil pack. 2. No new intracranial abnormality. Electronically signed by: Helayne Hurst MD  09/15/2024 05:51 AM EST RP Workstation: [id number]      Jayson PARAS Emmalie Haigh 09/16/2024, 9:45 AM

## 2024-09-16 NOTE — Assessment & Plan Note (Signed)
-   s/p EVD, now removed  - mentation improved and voiding well

## 2024-09-16 NOTE — Hospital Course (Signed)
 Mr. Payes is a 63 year old male with no significant PMH who originally presented to [facility] ER on 11/24 with a headache.  He was awoken the following morning by family and found to have altered mentation. He was taken to [facility] where [facility] showed acute SAH with concern for Acomm rupture and small volume IVH with mild ventriculomegaly c/w hydrocephalus. 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  Underwent EVD placement and aneurysmal coiling. Extubated since then. TCDs reassuring.  Significant Hospital Events:  11/24 admit, underwent EVD placement and aneurysmal coiling 09/06/24 Extubated  09/08/24 low-grade fever 100.2 09/13/24 EVD removed  12/3 started on hypertonic saline at 75 cc/h, checking serum sodium every 6 hours. Repeat head CT showing stable slight hydrocephalus   He completed 7-day course of Keppra  for seizure prophylaxis while in the ICU.  He also progressed well with physical therapy.  He was transferred to [facility] service on 09/16/2024.

## 2024-09-16 NOTE — Progress Notes (Signed)
 Transcranial Doppler   Date POD PCO2 HCT BP   MCA ACA PCA OPHT SIPH VERT Basilar  11/25 JH 1   35.9 96/68 Right  Left   69  44   *  -22   16  18   15  11    *  *   -14  -23   -38       11/26 JH  2    33 121/78  Right  Left    70   53    *   -22    21   23    17   16     *   *    -24   -23    -37       11/28   4    37  162/102 Right  Left    52   58    -27   *    22   28    25   24    29    *    -22   -24    -35        12/1 GC           Right  Left    46   41    -51   *    -31   45    18   19    26   20     -26   -17    -37        12/2 JH   8   36.9  175/90  Right  Left    58   66    -17   *    21   26    22   22     *   *    -26   -35    -40        12/4 [facility]         Right  Left    84   93    *   *    20   44    17   20    41   *    -26   -33    -28        12/5 GC         Right  Left    71  35    -38   -31    26   17    16   19     32   18    -19   -19    -22         MCA = Middle Cerebral Artery      OPHT = Opthalmic Artery     BASILAR = Basilar Artery   ACA = Anterior Cerebral Artery     SIPH = Carotid Siphon PCA = Posterior Cerebral Artery   VERT = Verterbral Artery                    Normal MCA = 62+\-12 ACA = 50+\-12 PCA = 42+\-23    Right Lindegaard Ratio: 2.45 Left Lindegaard Ratio: 1.51   Results can be found under chart review under CV PROC.  09/16/24 11:36 AM Cathlyn Collet RVT

## 2024-09-17 ENCOUNTER — Inpatient Hospital Stay (HOSPITAL_COMMUNITY)

## 2024-09-17 DIAGNOSIS — I609 Nontraumatic subarachnoid hemorrhage, unspecified: Secondary | ICD-10-CM | POA: Diagnosis not present

## 2024-09-17 LAB — CBC WITH DIFFERENTIAL/PLATELET
Abs Immature Granulocytes: 0.05 K/uL (ref 0.00–0.07)
Basophils Absolute: 0 K/uL (ref 0.0–0.1)
Basophils Relative: 1 %
Eosinophils Absolute: 0.2 K/uL (ref 0.0–0.5)
Eosinophils Relative: 2 %
HCT: 33.6 % — ABNORMAL LOW (ref 39.0–52.0)
Hemoglobin: 11.7 g/dL — ABNORMAL LOW (ref 13.0–17.0)
Immature Granulocytes: 1 %
Lymphocytes Relative: 21 %
Lymphs Abs: 1.7 K/uL (ref 0.7–4.0)
MCH: 32.6 pg (ref 26.0–34.0)
MCHC: 34.8 g/dL (ref 30.0–36.0)
MCV: 93.6 fL (ref 80.0–100.0)
Monocytes Absolute: 0.8 K/uL (ref 0.1–1.0)
Monocytes Relative: 10 %
Neutro Abs: 5.4 K/uL (ref 1.7–7.7)
Neutrophils Relative %: 65 %
Platelets: 308 K/uL (ref 150–400)
RBC: 3.59 MIL/uL — ABNORMAL LOW (ref 4.22–5.81)
RDW: 12.1 % (ref 11.5–15.5)
WBC: 8.2 K/uL (ref 4.0–10.5)
nRBC: 0 % (ref 0.0–0.2)

## 2024-09-17 LAB — BASIC METABOLIC PANEL WITH GFR
Anion gap: 9 (ref 5–15)
BUN: 16 mg/dL (ref 8–23)
CO2: 22 mmol/L (ref 22–32)
Calcium: 8.8 mg/dL — ABNORMAL LOW (ref 8.9–10.3)
Chloride: 100 mmol/L (ref 98–111)
Creatinine, Ser: 1.04 mg/dL (ref 0.61–1.24)
GFR, Estimated: >60 mL/min (ref >60)
Glucose, Bld: 117 mg/dL — ABNORMAL HIGH (ref 70–99)
Potassium: 3.6 mmol/L (ref 3.5–5.1)
Sodium: 131 mmol/L — ABNORMAL LOW (ref 135–145)

## 2024-09-17 LAB — MAGNESIUM: Magnesium: 1.9 mg/dL (ref 1.7–2.4)

## 2024-09-17 NOTE — Progress Notes (Signed)
 Progress Note    Clinton Phillips   FMW:[id number]  DOB: [birth year]  DOA: 09/05/2024     12 PCP: Pcp, No  Initial CC: AMS  Hospital Course: Clinton Phillips is a 63 year old male with no significant PMH who originally presented to [facility] ER on 11/24 with a headache.  He was awoken the following morning by family and found to have altered mentation. He was taken to [facility] where [facility] showed acute SAH with concern for Acomm rupture and small volume IVH with mild ventriculomegaly c/w hydrocephalus. 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  Underwent EVD placement and aneurysmal coiling. Extubated since then. TCDs reassuring.  Significant Hospital Events:  11/24 admit, underwent EVD placement and aneurysmal coiling 09/06/24 Extubated  09/08/24 low-grade fever 100.2 09/13/24 EVD removed  12/3 started on hypertonic saline at 75 cc/h, checking serum sodium every 6 hours. Repeat head CT showing stable slight hydrocephalus   He completed 7-day course of Keppra  for seizure prophylaxis while in the ICU.  He also progressed well with physical therapy.  He was transferred to [facility] service on 09/16/2024.  Interval History:  No events overnight.  Resting in bed comfortable this morning.  Slight headache but otherwise doing okay.  Heating pad on his back.  Assessment and Plan: * SAH (subarachnoid hemorrhage) (HCC) - s/p EVD and coiling of aneurysm.  TCD's have been reassuring per neurosurgery -Has undergone serial head imaging.  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  Discontinued on 12/4 - remains on salt tabs - Continue daily BMP while here  HTN (hypertension) -  BP goal systolic 130-1 50s -Continue nimodipine  x 21 days  - losartan  started in ICU as well    Antimicrobials:   DVT prophylaxis:  heparin  injection 5,000 Units Start: 09/06/24 1400 SCD's Start: 09/05/24 1338   Code Status:   Code Status: Full Code  Mobility Assessment (Last 72 Hours)     Mobility Assessment     Row Name 09/17/24 0730 09/16/24 2000 09/16/24 1849 09/16/24 1400 09/16/24 0800   Does the patient have exclusion criteria? No- Perform mobility assessment No- Perform mobility assessment No- Perform mobility assessment -- No- Perform mobility assessment   What is the highest level of mobility based on the mobility assessment? Level 4 (Ambulates with assistance) - Balance while stepping forward/back - Complete Level 4 (Ambulates with assistance) - Balance while stepping forward/back - Complete Level 4 (Ambulates with assistance) - Balance while stepping forward/back - Complete Level 4 (Ambulates with assistance) - Balance while stepping forward/back - Complete Level 4 (Ambulates with assistance) - Balance while stepping forward/back - Complete   Is the above level different from baseline mobility prior to current illness? Yes - Recommend PT order Yes - Recommend PT order Yes - Recommend PT order -- --    Row Name 09/15/24 2000 09/15/24 1446 09/15/24 1403 09/14/24 1930     Does the patient have exclusion criteria? No- Perform mobility assessment No- Perform mobility assessment -- No- Perform mobility assessment    What is the highest level of mobility based on the mobility assessment? Level 4 (Ambulates with assistance) - Balance while stepping forward/back - Complete Level 4 (Ambulates with  assistance) - Balance while stepping forward/back - Complete Level 4 (Ambulates with assistance) - Balance while stepping forward/back - Complete Level 3 (Stands with assistance) - Balance while standing  and cannot march in place    Is the above level different from baseline mobility prior to  current illness? Yes - Recommend PT order -- -- Yes - Recommend PT order       Diet: Diet Orders (From admission, onward)     Start     Ordered   09/07/24 1128  Diet regular Room service appropriate? Yes with Assist; Fluid consistency: Thin  Diet effective now       Question Answer Comment  Room service appropriate? Yes with Assist   Fluid consistency: Thin      09/07/24 1127            Barriers to discharge: None Disposition Plan: TBD.  Rehab versus home HH orders placed: TBD Status is: Inpatient  Objective: Blood pressure (!) 155/91, pulse 68, temperature 98.2 F (36.8 C), temperature source Oral, resp. rate 19, height 6' 1 (1.854 m), weight 104 kg, SpO2 100%.  Examination:  Physical Exam Constitutional:      General: He is not in acute distress.    Appearance: Normal appearance.  HENT:     Head: Normocephalic and atraumatic.     Comments: Healing anterior scalp incision from prior EVD     Mouth/Throat:     Mouth: Mucous membranes are moist.  Eyes:     Extraocular Movements: Extraocular movements intact.  Cardiovascular:     Rate and Rhythm: Normal rate and regular rhythm.  Pulmonary:     Effort: Pulmonary effort is normal. No respiratory distress.     Breath sounds: Normal breath sounds. No wheezing.  Abdominal:     General: Bowel sounds are normal. There is no distension.     Palpations: Abdomen is soft.     Tenderness: There is no abdominal tenderness.  Musculoskeletal:        General: Normal range of motion.     Cervical back: Normal range of motion and neck supple.  Skin:    General: Skin is warm and dry.  Neurological:     General: No focal deficit present.     Mental Status: He is alert.     Sensory: No sensory deficit.     Comments: Slightly slowed mentation but alert; no focal deficits   Psychiatric:        Mood and Affect: Mood normal.        Behavior: Behavior normal.      Consultants:  PCCM Neurosurgery  Data Reviewed: Results for  orders placed or performed during the hospital encounter of 09/05/24 (from the past 24 hours)  Sodium     Status: None   Collection Time: 09/16/24  1:58 PM  Result Value Ref Range   Sodium 137 135 - 145 mmol/L  Basic metabolic panel with GFR     Status: Abnormal   Collection Time: 09/17/24  3:48 AM  Result Value Ref Range   Sodium 131 (L) 135 - 145 mmol/L   Potassium 3.6 3.5 - 5.1 mmol/L   Chloride 100 98 - 111 mmol/L   CO2 22 22 - 32 mmol/L   Glucose, Bld 117 (H) 70 - 99 mg/dL   BUN 16 8 - 23 mg/dL   Creatinine, Ser 8.95 0.61 - 1.24 mg/dL   Calcium 8.8 (L) 8.9 - 10.3 mg/dL   GFR, Estimated >39 >39 mL/min  Anion gap 9 5 - 15  CBC with Differential/Platelet     Status: Abnormal   Collection Time: 09/17/24  3:48 AM  Result Value Ref Range   WBC 8.2 4.0 - 10.5 K/uL   RBC 3.59 (L) 4.22 - 5.81 MIL/uL   Hemoglobin 11.7 (L) 13.0 - 17.0 g/dL   HCT 66.3 (L) 60.9 - 47.9 %   MCV 93.6 80.0 - 100.0 fL   MCH 32.6 26.0 - 34.0 pg   MCHC 34.8 30.0 - 36.0 g/dL   RDW 87.8 88.4 - 84.4 %   Platelets 308 150 - 400 K/uL   nRBC 0.0 0.0 - 0.2 %   Neutrophils Relative % 65 %   Neutro Abs 5.4 1.7 - 7.7 K/uL   Lymphocytes Relative 21 %   Lymphs Abs 1.7 0.7 - 4.0 K/uL   Monocytes Relative 10 %   Monocytes Absolute 0.8 0.1 - 1.0 K/uL   Eosinophils Relative 2 %   Eosinophils Absolute 0.2 0.0 - 0.5 K/uL   Basophils Relative 1 %   Basophils Absolute 0.0 0.0 - 0.1 K/uL   Immature Granulocytes 1 %   Abs Immature Granulocytes 0.05 0.00 - 0.07 K/uL  Magnesium     Status: None   Collection Time: 09/17/24  3:48 AM  Result Value Ref Range   Magnesium 1.9 1.7 - 2.4 mg/dL    I have reviewed pertinent nursing notes, vitals, labs, and images as necessary. I have ordered labwork to follow up on as indicated.  I have reviewed the last notes from staff over past 24 hours. I have discussed patient's care plan and test results with nursing staff, CM/SW, and other staff as appropriate.  Old records reviewed in  assessment of this patient  Time spent: Greater than 50% of the 55 minute visit was spent in counseling/coordination of care for the patient as laid out in the A&P.   LOS: 12 days   Alm Apo, MD [facility] 09/17/2024, 1:33 PM

## 2024-09-17 NOTE — Progress Notes (Signed)
 Transcranial Doppler   Date POD PCO2 HCT BP   MCA ACA PCA OPHT SIPH VERT Basilar  11/25 JH 1   35.9 96/68 Right  Left   69  44   *  -22   16  18   15  11    *  *   -14  -23   -38       11/26 JH  2    33 121/78  Right  Left    70   53    *   -22    21   23    17   16     *   *    -24   -23    -37       11/28   4    37  162/102 Right  Left    52   58    -27   *    22   28    25   24    29    *    -22   -24    -35        12/1 GC           Right  Left    46   41    -51   *    -31   45    18   19    26   20     -26   -17    -37        12/2 JH   8   36.9  175/90  Right  Left    58   66    -17   *    21   26    22   22     *   *    -26   -35    -40        12/4 [facility]         Right  Left    84   93    *   *    20   44    17   20    41   *    -26   -33    -28        12/5 GC         Right  Left    71  35   -38  -31    26   17    16   19     32   18    -19   -19    -22       12/6 CK     Right  Left    80  67    *  -29     28   42    *  19    *  *   *  *   *  *      MCA = Middle Cerebral Artery      OPHT = Opthalmic Artery     BASILAR = Basilar Artery   ACA = Anterior Cerebral Artery     SIPH = Carotid Siphon PCA = Posterior Cerebral Artery   VERT = Verterbral Artery                    Normal MCA = 62+\-12 ACA = 50+\-12 PCA = 42+\-23    Right Lindegaard  Ratio: 2.86 Left Lindegaard Ratio: *   Results can be found under chart review under CV PROC.

## 2024-09-17 NOTE — Progress Notes (Signed)
 VASCULAR LAB    The vascular lab has attempted patient's TCD X 2. 1st attempt, patient in bathroom for extended time at 1315. Tech returned at 1415, patient eating lunch and asked tech to come back.  The vascular lab will re-attempt as schedule and patient permit.      Eros Montour, RVT 09/17/2024, 2:14 PM

## 2024-09-18 ENCOUNTER — Inpatient Hospital Stay (HOSPITAL_COMMUNITY)

## 2024-09-18 DIAGNOSIS — I609 Nontraumatic subarachnoid hemorrhage, unspecified: Secondary | ICD-10-CM | POA: Diagnosis not present

## 2024-09-18 LAB — BASIC METABOLIC PANEL WITH GFR
Anion gap: 6 (ref 5–15)
BUN: 16 mg/dL (ref 8–23)
CO2: 25 mmol/L (ref 22–32)
Calcium: 8.6 mg/dL — ABNORMAL LOW (ref 8.9–10.3)
Chloride: 100 mmol/L (ref 98–111)
Creatinine, Ser: 0.95 mg/dL (ref 0.61–1.24)
GFR, Estimated: >60 mL/min (ref >60)
Glucose, Bld: 115 mg/dL — ABNORMAL HIGH (ref 70–99)
Potassium: 4.2 mmol/L (ref 3.5–5.1)
Sodium: 131 mmol/L — ABNORMAL LOW (ref 135–145)

## 2024-09-18 LAB — MAGNESIUM: Magnesium: 1.9 mg/dL (ref 1.7–2.4)

## 2024-09-18 NOTE — Progress Notes (Signed)
 Transcranial Doppler  Date POD PCO2 HCT BP  MCA ACA PCA OPHT SIPH VERT Basilar  12/7rs/     Right  Left   84  96   -59  -37   28  37   16  26   48  51   -27  -26   -48           Right  Left                                            Right  Left                                             Right  Left                                             Right  Left                                            Right  Left                                            Right  Left                                        MCA = Middle Cerebral Artery      OPHT = Opthalmic Artery     BASILAR = Basilar Artery   ACA = Anterior Cerebral Artery     SIPH = Carotid Siphon PCA = Posterior Cerebral Artery   VERT = Verterbral Artery                   Normal MCA = 62+\-12 ACA = 50+\-12 PCA = 42+\-23   RIght Lindegaard Ratio 2.2 Left Lindegaard Ratio 3.6  Zebbie Ace Jones, RDMS, RVT

## 2024-09-18 NOTE — Progress Notes (Signed)
 Progress Note    Clinton Phillips   FMW:[id number]  DOB: [birth year]  DOA: 09/05/2024     13 PCP: Pcp, No  Initial CC: AMS  Hospital Course: Clinton Phillips is a 63 year old male with no significant PMH who originally presented to [facility] ER on 11/24 with a headache.  He was awoken the following morning by family and found to have altered mentation. He was taken to [facility] where [facility] showed acute SAH with concern for Acomm rupture and small volume IVH with mild ventriculomegaly c/w hydrocephalus. 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  Underwent EVD placement and aneurysmal coiling. Extubated since then. TCDs reassuring.  Significant Hospital Events:  11/24 admit, underwent EVD placement and aneurysmal coiling 09/06/24 Extubated  09/08/24 low-grade fever 100.2 09/13/24 EVD removed  12/3 started on hypertonic saline at 75 cc/h, checking serum sodium every 6 hours. Repeat head CT showing stable slight hydrocephalus   He completed 7-day course of Keppra  for seizure prophylaxis while in the ICU.  He also progressed well with physical therapy.  He was transferred to TRH service on 09/16/2024.  Interval History:  No events overnight.  Resting in bed comfortable this morning.  Slight headache but otherwise doing okay.  Heating pad on his back still works well.  Assessment and Plan: * SAH (subarachnoid hemorrhage) (HCC) - s/p EVD and coiling of aneurysm.  TCD's have been reassuring per neurosurgery -Has undergone serial head imaging.  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  Discontinued on 12/4 - remains on salt tabs - Continue daily BMP while here  HTN  (hypertension) - BP goal systolic 130-1 50s -Continue nimodipine  x 21 days  - losartan  started in ICU as well    Antimicrobials:   DVT prophylaxis:  heparin  injection 5,000 Units Start: 09/06/24 1400 SCD's Start: 09/05/24 1338   Code Status:   Code Status: Full Code  Mobility Assessment (Last 72 Hours)     Mobility Assessment     Row Name 09/18/24 0340 09/17/24 2318 09/17/24 1957 09/17/24 0730 09/16/24 2000   Does the patient have exclusion criteria? No- Perform mobility assessment No- Perform mobility assessment No- Perform mobility assessment No- Perform mobility assessment No- Perform mobility assessment   What is the highest level of mobility based on the mobility assessment? Level 4 (Ambulates with assistance) - Balance while stepping forward/back - Complete Level 4 (Ambulates with assistance) - Balance while stepping forward/back - Complete Level 4 (Ambulates with assistance) - Balance while stepping forward/back - Complete Level 4 (Ambulates with assistance) - Balance while stepping forward/back - Complete Level 4 (Ambulates with assistance) - Balance while stepping forward/back - Complete   Is the above level different from baseline mobility prior to current illness? Yes - Recommend PT order Yes - Recommend PT order Yes - Recommend PT order Yes - Recommend PT order Yes - Recommend PT order    Row Name 09/16/24 1849 09/16/24 1400 09/16/24 0800 09/15/24 2000 09/15/24 1446   Does the patient have exclusion criteria? No- Perform mobility assessment -- No- Perform mobility assessment No- Perform mobility assessment No- Perform mobility assessment   What is the highest level of mobility based on the mobility  assessment? Level 4 (Ambulates with assistance) - Balance while stepping forward/back - Complete Level 4 (Ambulates with assistance) - Balance while stepping forward/back - Complete Level 4 (Ambulates with assistance) - Balance while stepping forward/back - Complete Level 4  (Ambulates with assistance) - Balance while stepping forward/back - Complete Level 4 (Ambulates with assistance) - Balance while stepping forward/back - Complete   Is the above level different from baseline mobility prior to current illness? Yes - Recommend PT order -- -- Yes - Recommend PT order --    Row Name 09/15/24 1403           What is the highest level of mobility based on the mobility assessment? Level 4 (Ambulates with assistance) - Balance while stepping forward/back - Complete          Diet: Diet Orders (From admission, onward)     Start     Ordered   09/07/24 1128  Diet regular Room service appropriate? Yes with Assist; Fluid consistency: Thin  Diet effective now       Question Answer Comment  Room service appropriate? Yes with Assist   Fluid consistency: Thin      09/07/24 1127            Barriers to discharge: None Disposition Plan: TBD.  Rehab versus home HH orders placed: TBD Status is: Inpatient  Objective: Blood pressure 128/73, pulse 61, temperature 97.6 F (36.4 C), temperature source Oral, resp. rate 20, height 6' 1 (1.854 m), weight 104 kg, SpO2 94%.  Examination:  Physical Exam Constitutional:      General: He is not in acute distress.    Appearance: Normal appearance.  HENT:     Head: Normocephalic and atraumatic.     Comments: Healing anterior scalp incision from prior EVD     Mouth/Throat:     Mouth: Mucous membranes are moist.  Eyes:     Extraocular Movements: Extraocular movements intact.  Cardiovascular:     Rate and Rhythm: Normal rate and regular rhythm.  Pulmonary:     Effort: Pulmonary effort is normal. No respiratory distress.     Breath sounds: Normal breath sounds. No wheezing.  Abdominal:     General: Bowel sounds are normal. There is no distension.     Palpations: Abdomen is soft.     Tenderness: There is no abdominal tenderness.  Musculoskeletal:        General: Normal range of motion.     Cervical back: Normal range  of motion and neck supple.  Skin:    General: Skin is warm and dry.  Neurological:     General: No focal deficit present.     Mental Status: He is alert.     Sensory: No sensory deficit.     Comments: Slightly slowed mentation but alert; no focal deficits   Psychiatric:        Mood and Affect: Mood normal.        Behavior: Behavior normal.      Consultants:  PCCM Neurosurgery  Data Reviewed: Results for orders placed or performed during the hospital encounter of 09/05/24 (from the past 24 hours)  Basic metabolic panel with GFR     Status: Abnormal   Collection Time: 09/18/24  7:18 AM  Result Value Ref Range   Sodium 131 (L) 135 - 145 mmol/L   Potassium 4.2 3.5 - 5.1 mmol/L   Chloride 100 98 - 111 mmol/L   CO2 25 22 - 32 mmol/L   Glucose, Bld 115 (  H) 70 - 99 mg/dL   BUN 16 8 - 23 mg/dL   Creatinine, Ser 9.04 0.61 - 1.24 mg/dL   Calcium 8.6 (L) 8.9 - 10.3 mg/dL   GFR, Estimated >39 >39 mL/min   Anion gap 6 5 - 15  Magnesium     Status: None   Collection Time: 09/18/24  7:18 AM  Result Value Ref Range   Magnesium 1.9 1.7 - 2.4 mg/dL    I have reviewed pertinent nursing notes, vitals, labs, and images as necessary. I have ordered labwork to follow up on as indicated.  I have reviewed the last notes from staff over past 24 hours. I have discussed patient's care plan and test results with nursing staff, CM/SW, and other staff as appropriate.  Old records reviewed in assessment of this patient  Time spent: Greater than 50% of the 55 minute visit was spent in counseling/coordination of care for the patient as laid out in the A&P.   LOS: 13 days   Alm Apo, MD [facility] 09/18/2024, 12:14 PM

## 2024-09-18 NOTE — Plan of Care (Signed)
  Problem: Education: Goal: Knowledge of General Education information will improve Description: Including pain rating scale, medication(s)/side effects and non-pharmacologic comfort measures Outcome: Progressing   Problem: Activity: Goal: Risk for activity intolerance will decrease Outcome: Progressing   Problem: Nutrition: Goal: Adequate nutrition will be maintained Outcome: Progressing   Problem: Pain Managment: Goal: General experience of comfort will improve and/or be controlled Outcome: Progressing   Problem: Activity: Goal: Ability to tolerate increased activity will improve Outcome: Progressing

## 2024-09-19 ENCOUNTER — Telehealth (HOSPITAL_COMMUNITY): Payer: Self-pay | Admitting: Pharmacy Technician

## 2024-09-19 ENCOUNTER — Other Ambulatory Visit (HOSPITAL_COMMUNITY): Payer: Self-pay

## 2024-09-19 ENCOUNTER — Inpatient Hospital Stay (HOSPITAL_COMMUNITY)

## 2024-09-19 DIAGNOSIS — I609 Nontraumatic subarachnoid hemorrhage, unspecified: Secondary | ICD-10-CM

## 2024-09-19 LAB — BASIC METABOLIC PANEL WITH GFR
Anion gap: 10 (ref 5–15)
BUN: 14 mg/dL (ref 8–23)
CO2: 26 mmol/L (ref 22–32)
Calcium: 8.9 mg/dL (ref 8.9–10.3)
Chloride: 96 mmol/L — ABNORMAL LOW (ref 98–111)
Creatinine, Ser: 1.02 mg/dL (ref 0.61–1.24)
GFR, Estimated: >60 mL/min (ref >60)
Glucose, Bld: 113 mg/dL — ABNORMAL HIGH (ref 70–99)
Potassium: 4.1 mmol/L (ref 3.5–5.1)
Sodium: 132 mmol/L — ABNORMAL LOW (ref 135–145)

## 2024-09-19 MED ORDER — SODIUM CHLORIDE 1 G PO TABS
2.0000 g | ORAL_TABLET | Freq: Two times a day (BID) | ORAL | 0 refills | Status: AC
  Filled 2024-09-19: qty 20, 5d supply, fill #0

## 2024-09-19 MED ORDER — OXYCODONE HCL 5 MG PO TABS
5.0000 mg | ORAL_TABLET | Freq: Four times a day (QID) | ORAL | 0 refills | Status: DC | PRN
  Filled 2024-09-19: qty 20, 5d supply, fill #0

## 2024-09-19 MED ORDER — NIMODIPINE 30 MG PO CAPS
60.0000 mg | ORAL_CAPSULE | ORAL | 0 refills | Status: AC
  Filled 2024-09-19: qty 84, 7d supply, fill #0

## 2024-09-19 MED ORDER — LOSARTAN POTASSIUM 50 MG PO TABS
50.0000 mg | ORAL_TABLET | Freq: Every day | ORAL | 2 refills | Status: AC
  Filled 2024-09-19: qty 90, 90d supply, fill #0

## 2024-09-19 NOTE — Progress Notes (Signed)
 Physical Therapy Treatment Patient Details Name: Clinton Phillips MRN: [id number] DOB: Dec 25, 1960 Today's Date: 09/19/2024   History of Present Illness 63 yo male adm 11/24 with HA, AMS, acute SAH with concern for Acomm rupture and small volume IVH with mild ventriculomegaly c/w hydrocephalus. 11/24 EVD and coil embolization. ETT 11/24-11/25. 12/1 EVD removed. PMHx: none.    PT Comments  Pt pleasant and eager to return home today per his report. Pt aware of year not month or day, he is able to follow single step commands consistently although becomes distracted at times needing repetition of cues. Continues to follow 2 step commands 50% of the time. Gait without DME remains CGA and recommend RW for home use if not guarded to help prevent falls. Encouraged installation of rail on stairs for safety as well. OPPT remains appropriate with 24/7 supervision for cognition.     If plan is discharge home, recommend the following: A little help with walking and/or transfers;A little help with bathing/dressing/bathroom;Assistance with cooking/housework;Direct supervision/assist for financial management;Supervision due to cognitive status;Assist for transportation;Help with stairs or ramp for entrance   Can travel by private vehicle        Equipment Recommendations  Rolling walker (2 wheels);Other (comment) (rail for stairs)    Recommendations for Other Services       Precautions / Restrictions Precautions Precautions: Fall Recall of Precautions/Restrictions: Impaired     Mobility  Bed Mobility Overal bed mobility: Modified Independent Bed Mobility: Supine to Sit     Supine to sit: Modified independent (Device/Increase time)     General bed mobility comments: pt with increased time and struggle to rise to sitting due to back pain and tightness, bed flat and no physical assist    Transfers Overall transfer level: Needs assistance   Transfers: Sit to/from Stand Sit to Stand: Supervision            General transfer comment: supervision for safety, initial posterior bias corrected within a few seconds of static standing    Ambulation/Gait Ambulation/Gait assistance: Contact guard assist Gait Distance (Feet): 350 Feet Assistive device: None Gait Pattern/deviations: Step-through pattern, Decreased stride length   Gait velocity interpretation: 1.31 - 2.62 ft/sec, indicative of limited community ambulator   General Gait Details: pt with steady gait without RW, 1 partial LOB able to correct with tactile cues. Pt able to perform horizontal and vertical head turns and increase speed although difficulty with head turns due to pain tightness in spine   Stairs Stairs: Yes Stairs assistance: Supervision Stair Management: Alternating pattern, Forwards, One rail Left Number of Stairs: 6 General stair comments: pt reliant on rail and able to  ascend and descend with UB support   Wheelchair Mobility     Tilt Bed    Modified Rankin (Stroke Patients Only)       Balance Overall balance assessment: Mild deficits observed, not formally tested Sitting-balance support: Feet supported Sitting balance-Leahy Scale: Good     Standing balance support: No upper extremity supported, During functional activity Standing balance-Leahy Scale: Good Standing balance comment: minor deficits with gait and challenges                            Communication Communication Communication: No apparent difficulties  Cognition Arousal: Alert Behavior During Therapy: WFL for tasks assessed/performed   PT - Cognitive impairments: Awareness, Memory, Attention, Sequencing, Problem solving, Safety/Judgement, Orientation  Following commands: Impaired Following commands impaired: Follows one step commands with increased time, Follows multi-step commands inconsistently    Cueing Cueing Techniques: Verbal cues  Exercises      General Comments         Pertinent Vitals/Pain Pain Assessment Pain Assessment: 0-10 Pain Score: 4  Pain Location: back Pain Descriptors / Indicators: Aching Pain Intervention(s): Limited activity within patient's tolerance, Monitored during session, Repositioned    Home Living                          Prior Function            PT Goals (current goals can now be found in the care plan section) Progress towards PT goals: Progressing toward goals    Frequency    Min 2X/week      PT Plan      Co-evaluation              AM-PAC PT 6 Clicks Mobility   Outcome Measure  Help needed turning from your back to your side while in a flat bed without using bedrails?: None Help needed moving from lying on your back to sitting on the side of a flat bed without using bedrails?: A Little Help needed moving to and from a bed to a chair (including a wheelchair)?: A Little Help needed standing up from a chair using your arms (e.g., wheelchair or bedside chair)?: A Little Help needed to walk in hospital room?: A Little Help needed climbing 3-5 steps with a railing? : A Little 6 Click Score: 19    End of Session Equipment Utilized During Treatment: Gait belt Activity Tolerance: Patient tolerated treatment well Patient left: in chair;with call bell/phone within reach;with chair alarm set Nurse Communication: Mobility status PT Visit Diagnosis: Other abnormalities of gait and mobility (R26.89);Muscle weakness (generalized) (M62.81)     Time: 9069-9043 PT Time Calculation (min) (ACUTE ONLY): 26 min  Charges:    $Gait Training: 8-22 mins $Therapeutic Activity: 8-22 mins PT General Charges $$ ACUTE PT VISIT: 1 Visit                     Lenoard SQUIBB, PT Acute Rehabilitation Services Office: [phone]    [name] 09/19/2024, 11:11 AM

## 2024-09-19 NOTE — Progress Notes (Signed)
 Inpatient Rehab Admissions Coordinator:    I spoke with Pt. And confirmed he wants to d/c home with Outpatient PT/OT. CIR will sign off.   Leita Kleine, MS, CCC-SLP Rehab Admissions Coordinator  [phone] (celll) [phone] (office)

## 2024-09-19 NOTE — Progress Notes (Signed)
 Alert and oriented with normal speech expression, fluency and comprehension. Visual fields are full to confrontation.   Face is symmetric. Strength in the arms and legs is symmetric with no drift.  Sensation is normal and symmetric. No ataxia. No inattention.   Will arrange office follow up 4 weeks to discuss monitoring and his other unruptured aneurysm

## 2024-09-19 NOTE — Discharge Summary (Signed)
 Physician Discharge Summary   Clinton Phillips FMW:[id number] DOB: 10-03-1961 DOA: 09/05/2024  PCP: Freddrick, No  Admit date: 09/05/2024 Discharge date: 09/19/2024  Admitted From: Home Disposition:  Home Discharging physician: Alm Apo, MD Barriers to discharge: none  Recommendations at discharge: Follow up with neurosurgery   Discharge Condition: stable CODE STATUS: Full  Diet recommendation:  Diet Orders (From admission, onward)     Start     Ordered   09/19/24 0000  Diet general        09/19/24 1137   09/07/24 1128  Diet regular Room service appropriate? Yes with Assist; Fluid consistency: Thin  Diet effective now       Question Answer Comment  Room service appropriate? Yes with Assist   Fluid consistency: Thin      09/07/24 1127            Hospital Course: Clinton Phillips is a 62 year old male with no significant PMH who originally presented to [facility] ER on 11/24 with a headache.  He was awoken the following morning by family and found to have altered mentation. He was taken to [facility] where [facility] showed acute SAH with concern for Acomm rupture and small volume IVH with mild ventriculomegaly c/w hydrocephalus. 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  Underwent EVD placement and aneurysmal coiling. Extubated since then. TCDs reassuring.  Significant Hospital Events:  11/24 admit, underwent EVD placement and aneurysmal coiling 09/06/24 Extubated  09/08/24 low-grade fever 100.2 09/13/24 EVD removed  12/3 started on hypertonic saline at 75 cc/h, checking serum sodium every 6 hours. Repeat head CT showing stable slight hydrocephalus   He completed 7-day course of Keppra  for seizure prophylaxis while in the ICU.  He also progressed well with physical therapy.  He was transferred to [facility] service on 09/16/2024.  Assessment and Plan: * SAH (subarachnoid hemorrhage) (HCC) - s/p EVD and coiling of aneurysm.  TCD's have been reassuring per neurosurgery -Has undergone  serial head imaging.  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  Discontinued on 12/4 - remains on salt tabs; sodium improving.  Continued on 5 more days at discharge  HTN (hypertension) - BP goal systolic 130-1 50s -Continue nimodipine  x 21 days  - losartan  started in ICU as well     The patient's acute and chronic medical conditions were treated accordingly. On day of discharge, patient was felt deemed stable for discharge. Patient/family member advised to call PCP or come back to ER if needed.   Principal Diagnosis: SAH (subarachnoid hemorrhage) (HCC)  Discharge Diagnoses: Active Hospital Problems   Diagnosis Date Noted   SAH (subarachnoid hemorrhage) (HCC) 09/05/2024    Priority: 1.   Obstructive hydrocephalus (HCC) 09/12/2024    Priority: 2.   Hyponatremia 09/12/2024    Priority: 3.   HTN (hypertension) 09/16/2024    Resolved Hospital Problems  No resolved problems to display.     Discharge Instructions     Diet general   Complete by: As directed    Increase activity slowly   Complete by: As directed    No wound care   Complete by: As directed  Allergies as of 09/19/2024   No Known Allergies      Medication List     STOP taking these medications    predniSONE  10 MG tablet Commonly known as: DELTASONE        TAKE these medications    losartan  50 MG tablet Commonly known as: COZAAR  Take 1 tablet (50 mg total) by mouth daily. Start taking on: September 20, 2024   niMODipine  30 MG capsule Commonly known as: NIMOTOP  Take 2 capsules (60 mg total) by mouth every 4 (four) hours for 7 days.    oxyCODONE  5 MG immediate release tablet Commonly known as: Oxy IR/ROXICODONE  Take 1 tablet (5 mg total) by mouth every 6 (six) hours as needed for moderate pain (pain score 4-6), severe pain (pain score 7-10) or breakthrough pain.   sodium chloride  1 g tablet Take 2 tablets (2 g total) by mouth 2 (two) times daily with a meal for 5 days.               Durable Medical Equipment  (From admission, onward)           Start     Ordered   09/19/24 1135  For home use only DME Walker rolling  Once       Question Answer Comment  Walker: With 5 Inch Wheels   Patient needs a walker to treat with the following condition Generalized muscle weakness   Patient needs a walker to treat with the following condition SAH (subarachnoid hemorrhage) (HCC)      09/19/24 1134            Follow-up Information     Ostwalt, Janna, PA-C Follow up.   Specialty: Physician Assistant Why: TIME : 3:20 PM  PLEASE ARRIVE AT 3:00 PM DATE: JANUARY 30 , 2026 FRIDAY  PLEASE BRING ALL CUURENT MEDICATION, ID and INS CARD, CO-PAY Contact information: [address] [place] [address] [phone]         [facility] Follow up.   Specialty: Rehabilitation Why: Referral made for outpt Physical therapy and Occupational therapy- they will contact you to follow up and schedule Contact information: [address] [place]  [address] [phone]        [facility] (DME) Follow up.   Specialty: DME Services Why: Rolling walker arranged- to be delivered to pt prior discharge Contact information: [address] [place]  [address] [phone]               No Known Allergies  Consultations: Neurosurgery  Procedures: 09/05/24 PROCEDURE PERFORMED:  1.         Insertion RIGHT frontal external ventricular drain - bedside procedure  Discharge Exam: BP (!) 152/77  (BP Location: Left Arm)   Pulse 70   Temp 98.4 F (36.9 C) (Oral)   Resp 20   Ht 6' 1 (1.854 m)   Wt 104 kg   SpO2 94%   BMI 30.25 kg/m  Physical Exam Constitutional:      General: He is not in acute distress.    Appearance: Normal appearance.  HENT:     Head: Normocephalic and atraumatic.     Comments: Healing anterior scalp incision from prior EVD     Mouth/Throat:     Mouth: Mucous membranes are moist.  Eyes:     Extraocular Movements: Extraocular movements intact.  Cardiovascular:     Rate and Rhythm: Normal rate and regular rhythm.  Pulmonary:     Effort: Pulmonary effort is normal. No respiratory distress.     Breath sounds: Normal breath sounds. No wheezing.  Abdominal:     General: Bowel sounds are normal. There is no distension.     Palpations: Abdomen is soft.     Tenderness: There is no abdominal tenderness.  Musculoskeletal:        General: Normal range of motion.     Cervical back: Normal range of motion and neck supple.  Skin:    General: Skin is warm and dry.  Neurological:     General: No focal deficit present.     Mental Status: He is alert.     Sensory: No sensory deficit.     Comments: Slightly slowed mentation but alert; no focal deficits   Psychiatric:        Mood and Affect: Mood normal.        Behavior: Behavior normal.      The results of significant diagnostics from this hospitalization (including imaging, microbiology, ancillary and laboratory) are listed below for reference.   Microbiology: No results found for this or any previous visit (from the past 240 hours).   Labs: BNP (last 3 results) No results for input(s): BNP in the last 8760 hours. Basic Metabolic Panel: Recent Labs  Lab 09/13/24 0222 09/13/24 0736 09/15/24 0513 09/15/24 0831 09/16/24 0421 09/16/24 0800 09/16/24 1358 09/17/24 0348 09/18/24 0718 09/19/24 0602  NA 133*   < > 136   < > 138 137 137 131* 131* 132*  K 4.3   < > 4.0  --  3.7  --   --  3.6 4.2 4.1   CL 100   < > 107  --  101  --   --  100 100 96*  CO2 24   < > 21*  --  24  --   --  22 25 26   GLUCOSE 125*   < > 124*  --  116*  --   --  117* 115* 113*  BUN 16   < > 15  --  14  --   --  16 16 14   CREATININE 1.15   < > 0.93  --  0.98  --   --  1.04 0.95 1.02  CALCIUM 9.0   < > 8.8*  --  9.2  --   --  8.8* 8.6* 8.9  MG 2.5*  --   --   --  2.3  --   --  1.9 1.9  --   PHOS 4.0  --   --   --   --   --   --   --   --   --    < > = values in this interval not displayed.   Liver Function Tests: No results for input(s): AST, ALT, ALKPHOS, BILITOT, PROT, ALBUMIN in the last 168 hours. No results for input(s): LIPASE, AMYLASE in the last 168 hours. No results for input(s): AMMONIA in the last 168 hours. CBC: Recent Labs  Lab 09/13/24 0222 09/17/24 0348  WBC 7.8 8.2  NEUTROABS 5.5 5.4  HGB 13.0 11.7*  HCT 36.9* 33.6*  MCV 92.5 93.6  PLT 239 308   Cardiac Enzymes: No results for input(s): CKTOTAL, CKMB, CKMBINDEX, TROPONINI in the last 168 hours. BNP: Invalid input(s): POCBNP CBG: Recent Labs  Lab 09/12/24 2308  GLUCAP 136*   D-Dimer No results for input(s): DDIMER in the last 72 hours. Hgb A1c No results for  input(s): HGBA1C in the last 72 hours. Lipid Profile No results for input(s): CHOL, HDL, LDLCALC, TRIG, CHOLHDL, LDLDIRECT in the last 72 hours. Thyroid function studies No results for input(s): TSH, T4TOTAL, T3FREE, THYROIDAB in the last 72 hours.  Invalid input(s): FREET3 Anemia work up No results for input(s): VITAMINB12, FOLATE, FERRITIN, TIBC, IRON, RETICCTPCT in the last 72 hours. Urinalysis    Component Value Date/Time   COLORURINE AMBER (A) 01/05/2021 1748   APPEARANCEUR CLOUDY (A) 01/05/2021 1748   LABSPEC 1.021 01/05/2021 1748   PHURINE 5.0 01/05/2021 1748   GLUCOSEU NEGATIVE 01/05/2021 1748   HGBUR LARGE (A) 01/05/2021 1748   BILIRUBINUR NEGATIVE 01/05/2021 1748   KETONESUR NEGATIVE  01/05/2021 1748   PROTEINUR 30 (A) 01/05/2021 1748   NITRITE NEGATIVE 01/05/2021 1748   LEUKOCYTESUR NEGATIVE 01/05/2021 1748   Sepsis Labs Recent Labs  Lab 09/13/24 0222 09/17/24 0348  WBC 7.8 8.2   Microbiology No results found for this or any previous visit (from the past 240 hours).  Procedures/Studies: VAS US  TRANSCRANIAL DOPPLER Result Date: 09/18/2024  Transcranial Doppler Patient Name:  Clinton Phillips  Date of Exam:   09/18/2024 Medical Rec #: [id number]        Accession #:    [id number] Date of Birth: [birth year]        Patient Gender: M Patient Age:   74 years Exam Location:  [facility] Procedure:      VAS US  TRANSCRANIAL DOPPLER Referring Phys: SAMMI GORE --------------------------------------------------------------------------------  Indications: Subarachnoid hemorrhage. Performing Technologist: Ricka Sturdivant-Jones RDMS, RVT  Examination Guidelines: A complete evaluation includes B-mode imaging, spectral Doppler, color Doppler, and power Doppler as needed of all accessible portions of each vessel. Bilateral testing is considered an integral part of a complete examination. Limited examinations for reoccurring indications may be performed as noted.  +----------+---------------+----------+-----------+-------+ RIGHT TCD Right VM (cm/s)Depth (cm)PulsatilityComment +----------+---------------+----------+-----------+-------+ MCA             84                    1.02            +----------+---------------+----------+-----------+-------+ ACA             -59                   1.05            +----------+---------------+----------+-----------+-------+ Term ICA        43                    1.26            +----------+---------------+----------+-----------+-------+ PCA P1          28                    1.20            +----------+---------------+----------+-----------+-------+ Opthalmic       16                    1.03             +----------+---------------+----------+-----------+-------+ ICA siphon      48                    1.21            +----------+---------------+----------+-----------+-------+ Vertebral       -27  1.11            +----------+---------------+----------+-----------+-------+ Distal ICA      37                    1.19            +----------+---------------+----------+-----------+-------+  +----------+--------------+----------+-----------+-------+ LEFT TCD  Left VM (cm/s)Depth (cm)PulsatilityComment +----------+--------------+----------+-----------+-------+ MCA             96                   1.02            +----------+--------------+----------+-----------+-------+ ACA            -37                   1.12            +----------+--------------+----------+-----------+-------+ Term ICA        53                   1.13            +----------+--------------+----------+-----------+-------+ PCA P1          37                   1.01            +----------+--------------+----------+-----------+-------+ Opthalmic       26                   1.59            +----------+--------------+----------+-----------+-------+ ICA siphon      51                   1.05            +----------+--------------+----------+-----------+-------+ Vertebral      -26                   1.18            +----------+--------------+----------+-----------+-------+ Distal ICA      26                   1.23            +----------+--------------+----------+-----------+-------+  +------------+-------+-------+             VM cm/sComment +------------+-------+-------+ Prox Basilar  -26          +------------+-------+-------+ Dist Basilar  -48          +------------+-------+-------+ +----------------------+---+ Right Lindegaard Ratio2.2 +----------------------+---+ +---------------------+---+ Left Lindegaard Ratio3.6 +---------------------+---+    Preliminary     VAS US  TRANSCRANIAL DOPPLER Result Date: 09/17/2024  Transcranial Doppler Patient Name:  Clinton Phillips  Date of Exam:   09/17/2024 Medical Rec #: [id number]        Accession #:    [id number] Date of Birth: February 03, 1961        Patient Gender: M Patient Age:   89 years Exam Location:  [facility] Procedure:      VAS US  TRANSCRANIAL DOPPLER Referring Phys: SAMMI GORE --------------------------------------------------------------------------------  Indications: Subarachnoid hemorrhage. Limitations: Patient movement Comparison Study: Prior TCD done 09/16/2024 Performing Technologist: Alberta Lis RVS  Examination Guidelines: A complete evaluation includes B-mode imaging, spectral Doppler, color Doppler, and power Doppler as needed of all accessible portions of each vessel. Bilateral testing is considered an integral part of a complete examination. Limited examinations for reoccurring indications may be performed as noted.  +----------+---------------+----------+-----------+------------------+  RIGHT TCD Right VM (cm/s)Depth (cm)Pulsatility     Comment       +----------+---------------+----------+-----------+------------------+ MCA             80                    1.20                       +----------+---------------+----------+-----------+------------------+ ACA                                           unable to insonate +----------+---------------+----------+-----------+------------------+ Term ICA        41                    1.37                       +----------+---------------+----------+-----------+------------------+ PCA P1          28                    1.10                       +----------+---------------+----------+-----------+------------------+ Opthalmic                                     unable to insonate +----------+---------------+----------+-----------+------------------+ ICA siphon                                    unable to insonate  +----------+---------------+----------+-----------+------------------+ Vertebral                                     unable to insonate +----------+---------------+----------+-----------+------------------+ Distal ICA      -28                   0.90                       +----------+---------------+----------+-----------+------------------+  +----------+--------------+----------+-----------+--------------------+ LEFT TCD  Left VM (cm/s)Depth (cm)Pulsatility      Comment        +----------+--------------+----------+-----------+--------------------+ MCA             67                   1.29                         +----------+--------------+----------+-----------+--------------------+ ACA            -29                   1.32                         +----------+--------------+----------+-----------+--------------------+ Term ICA        29                   1.23                         +----------+--------------+----------+-----------+--------------------+ PCA P1  42                   1.20                         +----------+--------------+----------+-----------+--------------------+ Opthalmic       19                   1.51                         +----------+--------------+----------+-----------+--------------------+ ICA siphon                                    unable to insonate  +----------+--------------+----------+-----------+--------------------+ Vertebral                                     unable to insonate  +----------+--------------+----------+-----------+--------------------+ Distal ICA                                   Transcranial Doppler +----------+--------------+----------+-----------+--------------------+  +------------+-------+------------------+             VM cm/s     Comment       +------------+-------+------------------+ Prox Basilar       unable to insonate +------------+-------+------------------+  +----------------------+----+ Right Lindegaard Ratio2.86 +----------------------+----+    Preliminary    VAS US  TRANSCRANIAL DOPPLER Result Date: 09/16/2024  Transcranial Doppler Patient Name:  Clinton Phillips  Date of Exam:   09/16/2024 Medical Rec #: [id number]        Accession #:    [id number] Date of Birth: 14-Jan-1961        Patient Gender: M Patient Age:   25 years Exam Location:  [facility] Procedure:      VAS US  TRANSCRANIAL DOPPLER Referring Phys: SAMMI GORE --------------------------------------------------------------------------------  Indications: Subarachnoid hemorrhage. Comparison       09/06/2024, 09/07/2024, 09/09/2024, 09/12/2024, 09/13/2024, Study:           09/15/2024 Performing Technologist: Cordella Collet RVT  Examination Guidelines: A complete evaluation includes B-mode imaging, spectral Doppler, color Doppler, and power Doppler as needed of all accessible portions of each vessel. Bilateral testing is considered an integral part of a complete examination. Limited examinations for reoccurring indications may be performed as noted.  +----------+---------------+----------+-----------+-------+ RIGHT TCD Right VM (cm/s)Depth (cm)PulsatilityComment +----------+---------------+----------+-----------+-------+ MCA             71                    1.19            +----------+---------------+----------+-----------+-------+ ACA             -38                   1.22            +----------+---------------+----------+-----------+-------+ Term ICA        31                    1.42            +----------+---------------+----------+-----------+-------+ PCA P1          26  1.17            +----------+---------------+----------+-----------+-------+ Opthalmic       16                    1.09            +----------+---------------+----------+-----------+-------+ ICA siphon      32                    1.02             +----------+---------------+----------+-----------+-------+ Vertebral       -19                   1.48            +----------+---------------+----------+-----------+-------+ Distal ICA      -29                   1.42            +----------+---------------+----------+-----------+-------+  +----------+--------------+----------+-----------+-------+ LEFT TCD  Left VM (cm/s)Depth (cm)PulsatilityComment +----------+--------------+----------+-----------+-------+ MCA             35                   1.25            +----------+--------------+----------+-----------+-------+ ACA            -31                   1.11            +----------+--------------+----------+-----------+-------+ Term ICA        31                   1.22            +----------+--------------+----------+-----------+-------+ PCA P1          17                   1.33            +----------+--------------+----------+-----------+-------+ Opthalmic       19                   1.55            +----------+--------------+----------+-----------+-------+ ICA siphon      18                   1.32            +----------+--------------+----------+-----------+-------+ Vertebral      -19                   1.14            +----------+--------------+----------+-----------+-------+ Distal ICA     -18                   1.56            +----------+--------------+----------+-----------+-------+  +------------+-------+-------+             VM cm/sComment +------------+-------+-------+ Prox Basilar  -22   1.51   +------------+-------+-------+ +----------------------+----+ Right Lindegaard Ratio2.45 +----------------------+----+ +---------------------+----+ Left Lindegaard Ratio1.94 +---------------------+----+    Preliminary    VAS US  TRANSCRANIAL DOPPLER Result Date: 09/15/2024  Transcranial Doppler Patient Name:  YERACHMIEL Rodda  Date of Exam:   09/15/2024 Medical Rec #: [id number]        Accession  #:    [id number] Date of Birth: 1961/06/09  Patient Gender: M Patient Age:   61 years Exam Location:  [facility] Procedure:      VAS US  TRANSCRANIAL DOPPLER Referring Phys: SAMMI GORE --------------------------------------------------------------------------------  Indications: Subarachnoid hemorrhage. Comparison Study: 09/12/24 TCD Performing Technologist: Ezzie Potters RVT, RDMS  Examination Guidelines: A complete evaluation includes B-mode imaging, spectral Doppler, color Doppler, and power Doppler as needed of all accessible portions of each vessel. Bilateral testing is considered an integral part of a complete examination. Limited examinations for reoccurring indications may be performed as noted.  +----------+---------------+----------+-----------+------------------+ RIGHT TCD Right VM (cm/s)Depth (cm)Pulsatility     Comment       +----------+---------------+----------+-----------+------------------+ MCA             84                    0.78                       +----------+---------------+----------+-----------+------------------+ ACA                                           unable to insonate +----------+---------------+----------+-----------+------------------+ Term ICA        38                     1.0                       +----------+---------------+----------+-----------+------------------+ PCA P1          20                    0.97                       +----------+---------------+----------+-----------+------------------+ Opthalmic       17                    1.41                       +----------+---------------+----------+-----------+------------------+ ICA siphon      41                    0.90                       +----------+---------------+----------+-----------+------------------+ Vertebral       -26                   1.13                       +----------+---------------+----------+-----------+------------------+ Distal ICA       -33                                              +----------+---------------+----------+-----------+------------------+  +----------+--------------+----------+-----------+------------------+ LEFT TCD  Left VM (cm/s)Depth (cm)Pulsatility     Comment       +----------+--------------+----------+-----------+------------------+ MCA             93                   0.91                       +----------+--------------+----------+-----------+------------------+  ACA                                          unable to insonate +----------+--------------+----------+-----------+------------------+ Term ICA        43                   0.75                       +----------+--------------+----------+-----------+------------------+ PCA P1          44                   0.87                       +----------+--------------+----------+-----------+------------------+ Opthalmic       20                   1.38                       +----------+--------------+----------+-----------+------------------+ ICA siphon                                   unable to insonate +----------+--------------+----------+-----------+------------------+ Vertebral      -33                   0.93                       +----------+--------------+----------+-----------+------------------+ Distal ICA     -31                                              +----------+--------------+----------+-----------+------------------+  +------------+-------+-------+             VM cm/sComment +------------+-------+-------+ Prox Basilar  -28          +------------+-------+-------+ +----------------------+----+ Right Lindegaard Ratio2.05 +----------------------+----+ +---------------------+---+ Left Lindegaard Ratio3.0 +---------------------+---+    Preliminary    VAS US  TRANSCRANIAL DOPPLER Result Date: 09/15/2024  Transcranial Doppler Patient Name:  Clinton Phillips  Date of Exam:   09/14/2024 Medical Rec  #: [id number]        Accession #:    [id number] Date of Birth: 1961/01/22        Patient Gender: M Patient Age:   57 years Exam Location:  [facility] Procedure:      VAS US  TRANSCRANIAL DOPPLER Referring Phys: SAMMI GORE --------------------------------------------------------------------------------  Indications: Subarachnoid hemorrhage. Performing Technologist: Ricka Sturdivant-Jones RDMS, RVT  Examination Guidelines: A complete evaluation includes B-mode imaging, spectral Doppler, color Doppler, and power Doppler as needed of all accessible portions of each vessel. Bilateral testing is considered an integral part of a complete examination. Limited examinations for reoccurring indications may be performed as noted.  +----------+---------------+----------+-----------+-------+ RIGHT TCD Right VM (cm/s)Depth (cm)PulsatilityComment +----------+---------------+----------+-----------+-------+ MCA             72                    1.07            +----------+---------------+----------+-----------+-------+ ACA             -46  1.19            +----------+---------------+----------+-----------+-------+ Term ICA        38                    1.24            +----------+---------------+----------+-----------+-------+ PCA P1          23                    0.95            +----------+---------------+----------+-----------+-------+ Opthalmic       23                    1.33            +----------+---------------+----------+-----------+-------+ ICA siphon      37                    1.14            +----------+---------------+----------+-----------+-------+ Vertebral       -30                   1.24            +----------+---------------+----------+-----------+-------+ Distal ICA      31                    1.11            +----------+---------------+----------+-----------+-------+  +----------+--------------+----------+-----------+-------+ LEFT TCD   Left VM (cm/s)Depth (cm)PulsatilityComment +----------+--------------+----------+-----------+-------+ MCA             61                   1.23            +----------+--------------+----------+-----------+-------+ ACA            -32                   1.03            +----------+--------------+----------+-----------+-------+ Term ICA        34                   0.95            +----------+--------------+----------+-----------+-------+ PCA P1          34                   1.05            +----------+--------------+----------+-----------+-------+ Opthalmic       26                   1.29            +----------+--------------+----------+-----------+-------+ ICA siphon      45                   1.09            +----------+--------------+----------+-----------+-------+ Vertebral      -30                   0.95            +----------+--------------+----------+-----------+-------+ Distal ICA      32                   0.99            +----------+--------------+----------+-----------+-------+  +------------+-------+-------+  VM cm/sComment +------------+-------+-------+ Prox Basilar  -43          +------------+-------+-------+ Dist Basilar  -28          +------------+-------+-------+ +----------------------+---+ Right Lindegaard Ratio2.3 +----------------------+---+ +---------------------+---+ Left Lindegaard Ratio1.9 +---------------------+---+  Summary: This was a normal transcranial Doppler study, with normal flow direction and velocity of all identified vessels of the anterior and posterior circulations, with no evidence of stenosis, vasospasm or occlusion. There was no evidence of intracranial disease. *See table(s) above for TCD measurements and observations.  Diagnosing physician: Eather Popp MD Electronically signed by Eather Popp MD on 09/15/2024 at 11:51:20 AM.    Final    CT HEAD WO CONTRAST ( ) Result Date: 09/15/2024 EXAM: CT  HEAD WITHOUT CONTRAST 09/15/2024 05:38:39 AM TECHNIQUE: CT of the head was performed without the administration of intravenous contrast. Automated exposure control, iterative reconstruction, and/or weight based adjustment of the mA/kV was utilized to reduce the radiation dose to as low as reasonably achievable. COMPARISON: Head CT 09/13/2024 and earlier. 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 Coil embolized anterior communicating artery aneurysm. FINDINGS: BRAIN AND VENTRICLES: Anterior communicating artery metal embolization coils are noted and appear stable. Stable gray-white differentiation. Stable left paracentral posterior fossa probable arachnoid cyst. Stable small volume residual intracranial hemorrhage, including at the septum pellucidum, along previous right EVD shunt tract, within the lateral ventricles, and Small volume bilateral subarachnoid blood. Ventricle size is stable, improved compared to presentation head CT. No transependymal edema. No evidence of acute infarct. No intracranial mass effect or midline shift. Calcified atherosclerosis at the skull base. ORBITS: No acute abnormality. SINUSES: Paranasal sinuses, tympanic cavities and mastoids well aerated. SOFT TISSUES AND SKULL: Right superior frontal bone burr hole again noted with stable overlying postoperative changes to the scalp. No acute orbital scalp soft tissue finding. No acute osseous abnormality. No skull fracture. IMPRESSION: 1. Stable small volume residual intracranial hemorrhage. Stable ventricle size and configuration. Anterior communicating artery coil pack. 2. No new intracranial abnormality. Electronically signed by: Helayne Hurst MD 09/15/2024 05:51 AM EST RP Workstation: [id number]   VAS US  TRANSCRANIAL DOPPLER Result Date: 09/14/2024  Transcranial Doppler Patient Name:  Clinton Phillips  Date of Exam:   09/13/2024 Medical Rec #: [id number]        Accession #:    [id number] Date of  Birth: 08-06-1961        Patient Gender: M Patient Age:   54 years Exam Location:  [facility] Procedure:      VAS US  TRANSCRANIAL DOPPLER Referring Phys: DINO SABLE --------------------------------------------------------------------------------  Indications: Subarachnoid hemorrhage. Limitations: VERY difficult study, patient uncooperative and continuously moving              and talking despite several attempts at redirection. Comparison Study: Previous exam on 09/12/2024 Performing Technologist: Ezzie Potters RVT, RDMS  Examination Guidelines: A complete evaluation includes B-mode imaging, spectral Doppler, color Doppler, and power Doppler as needed of all accessible portions of each vessel. Bilateral testing is considered an integral part of a complete examination. Limited examinations for reoccurring indications may be performed as noted.  +----------+---------------+----------+-----------+------------------+ RIGHT TCD Right VM (cm/s)Depth (cm)Pulsatility     Comment       +----------+---------------+----------+-----------+------------------+ MCA             58                    1.06                       +----------+---------------+----------+-----------+------------------+  ACA             -17                   1.01                       +----------+---------------+----------+-----------+------------------+ Term ICA        40                    1.30                       +----------+---------------+----------+-----------+------------------+ PCA P1          21                    1.23                       +----------+---------------+----------+-----------+------------------+ Opthalmic       22                    1.67                       +----------+---------------+----------+-----------+------------------+ ICA siphon                                    unable to insonate +----------+---------------+----------+-----------+------------------+ Vertebral       -26                    1.76                       +----------+---------------+----------+-----------+------------------+ Distal ICA      25                    1.13                       +----------+---------------+----------+-----------+------------------+  +----------+--------------+----------+-----------+------------------+ LEFT TCD  Left VM (cm/s)Depth (cm)Pulsatility     Comment       +----------+--------------+----------+-----------+------------------+ MCA             66                   1.33                       +----------+--------------+----------+-----------+------------------+ ACA                                          unable to insonate +----------+--------------+----------+-----------+------------------+ Term ICA        41                   1.40                       +----------+--------------+----------+-----------+------------------+ PCA P1          26                   1.24                       +----------+--------------+----------+-----------+------------------+ Opthalmic       22  1.75                       +----------+--------------+----------+-----------+------------------+ ICA siphon                                   unable to insonate +----------+--------------+----------+-----------+------------------+ Vertebral      -35                   1.31                       +----------+--------------+----------+-----------+------------------+ Distal ICA      23                   1.34                       +----------+--------------+----------+-----------+------------------+  +------------+-------+------------------+             VM cm/s     Comment       +------------+-------+------------------+ Prox Basilar  -40         1.26        +------------+-------+------------------+ Dist Basilar       unable to insonate +------------+-------+------------------+ +----------------------+----+ Right Lindegaard Ratio2.32  +----------------------+----+ +---------------------+----+ Left Lindegaard Ratio2.87 +---------------------+----+  Summary: This was a normal transcranial Doppler study, with normal flow direction and velocity of all identified vessels of the anterior and posterior circulations, with no evidence of stenosis, vasospasm or occlusion. There was no evidence of intracranial disease. *See table(s) above for TCD measurements and observations.  Diagnosing physician: Eather Popp MD Electronically signed by Eather Popp MD on 09/14/2024 at 11:24:36 AM.    Final    US  EKG SITE RITE Result Date: 09/13/2024 If Site Rite image not attached, placement could not be confirmed due to current cardiac rhythm.  CT HEAD WO CONTRAST ( ) Result Date: 09/13/2024 EXAM: CT HEAD WITHOUT 09/13/2024 03:02:06 AM TECHNIQUE: CT of the head was performed without the administration of intravenous contrast. Automated exposure control, iterative reconstruction, and/or weight based adjustment of the mA/kV was utilized to reduce the radiation dose to as low as reasonably achievable. COMPARISON: 09/12/2024 CLINICAL HISTORY: Follow up on hydrocephalus FINDINGS: BRAIN AND VENTRICLES: EVD catheter has been removed. There is a small amount of intraparenchymal hemorrhage along catheter tract. Unchanged subarachnoid hemorrhage along falx and bilateral convexities. 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 Stable hydrocephalus. Unchanged intraventricular hemorrhage in bilateral occipital horns. Aneurysm coil along anterior communicating artery with associated artifact. No mass effect or midline shift. No evidence of acute infarct. ORBITS: No acute abnormality. SINUSES AND MASTOIDS: No acute abnormality. SOFT TISSUES AND SKULL: No acute skull fracture. No acute soft tissue abnormality. IMPRESSION: 1. Stable hydrocephalus and unchanged intraventricular hemorrhage in the bilateral occipital  horns. 2. Unchanged subarachnoid hemorrhage along the falx and bilateral convexities. 3. Small amount of intraparenchymal hemorrhage along the catheter tract. Electronically signed by: Franky Stanford MD 09/13/2024 03:13 AM EST RP Workstation: [id number]   VAS US  TRANSCRANIAL DOPPLER Result Date: 09/12/2024  Transcranial Doppler Patient Name:  Clinton Phillips  Date of Exam:   09/12/2024 Medical Rec #: [id number]        Accession #:    [id number] Date of Birth: [birth year]        Patient Gender: M Patient Age:   5 years Exam Location:  [facility] Procedure:      VAS US  TRANSCRANIAL DOPPLER Referring Phys: RAHUL DESAI --------------------------------------------------------------------------------  Indications: Subarachnoid hemorrhage. Limitations: patient movement, patient talking, intermittent somnolence, patient              positioning Comparison Study: 09/06/2024, 09/07/2024, 09/09/2024 Performing Technologist: Cordella Collet RVT  Examination Guidelines: A complete evaluation includes B-mode imaging, spectral Doppler, color Doppler, and power Doppler as needed of all accessible portions of each vessel. Bilateral testing is considered an integral part of a complete examination. Limited examinations for reoccurring indications may be performed as noted.  +----------+---------------+----------+-----------+-------+ RIGHT TCD Right VM (cm/s)Depth (cm)PulsatilityComment +----------+---------------+----------+-----------+-------+ MCA             46                    1.23            +----------+---------------+----------+-----------+-------+ ACA             -51                   1.18            +----------+---------------+----------+-----------+-------+ Term ICA        17                    1.21            +----------+---------------+----------+-----------+-------+ PCA P1          -31                   1.36            +----------+---------------+----------+-----------+-------+  Opthalmic       18                    0.96            +----------+---------------+----------+-----------+-------+ ICA siphon      26                    1.39            +----------+---------------+----------+-----------+-------+ Vertebral       -26                   1.44            +----------+---------------+----------+-----------+-------+ Distal ICA      -34                   1.21            +----------+---------------+----------+-----------+-------+  +----------+--------------+----------+-----------+------------------+ LEFT TCD  Left VM (cm/s)Depth (cm)Pulsatility     Comment       +----------+--------------+----------+-----------+------------------+ MCA             41                   1.31                       +----------+--------------+----------+-----------+------------------+ ACA                                          Unable to insonate +----------+--------------+----------+-----------+------------------+ Term ICA       -24                   1.38                       +----------+--------------+----------+-----------+------------------+  PCA P1          45                   1.26                       +----------+--------------+----------+-----------+------------------+ Opthalmic       19                   1.63                       +----------+--------------+----------+-----------+------------------+ ICA siphon      20                    1.5                       +----------+--------------+----------+-----------+------------------+ Vertebral      -17                   1.27                       +----------+--------------+----------+-----------+------------------+ Distal ICA     -28                   1.18                       +----------+--------------+----------+-----------+------------------+  +------------+-------+-------+             VM cm/sComment +------------+-------+-------+ Prox Basilar  -25   1.39    +------------+-------+-------+ Dist Basilar  -37   1.23   +------------+-------+-------+ +----------------------+----+ Right Lindegaard Ratio1.35 +----------------------+----+ +---------------------+----+ Left Lindegaard Ratio1.46 +---------------------+----+  Summary: This was a normal transcranial Doppler study, with normal flow direction and velocity of all identified vessels of the anterior and posterior circulations, with no evidence of stenosis, vasospasm or occlusion. There was no evidence of intracranial disease. Increased pulsatility indices suggest increased intracranial pressure likely *See table(s) above for TCD measurements and observations.  Diagnosing physician: Eather Popp MD Electronically signed by Eather Popp MD on 09/12/2024 at 1:38:09 PM.    Final    VAS US  TRANSCRANIAL DOPPLER Result Date: 09/12/2024  Transcranial Doppler Patient Name:  Clinton Phillips  Date of Exam:   09/09/2024 Medical Rec #: [id number]        Accession #:    [id number] Date of Birth: Jul 28, 1961        Patient Gender: M Patient Age:   76 years Exam Location:  [facility] Procedure:      VAS US  TRANSCRANIAL DOPPLER Referring Phys: SAMMI GORE --------------------------------------------------------------------------------  Indications: Subarachnoid hemorrhage. Comparison Study: Prior TCD 11/28 Performing Technologist: Alberta Lis RVS  Examination Guidelines: A complete evaluation includes B-mode imaging, spectral Doppler, color Doppler, and power Doppler as needed of all accessible portions of each vessel. Bilateral testing is considered an integral part of a complete examination. Limited examinations for reoccurring indications may be performed as noted.  +----------+---------------+----------+-----------+-------+ RIGHT TCD Right VM (cm/s)Depth (cm)PulsatilityComment +----------+---------------+----------+-----------+-------+ MCA             52                    1.04             +----------+---------------+----------+-----------+-------+ ACA             -27  1.18            +----------+---------------+----------+-----------+-------+ Term ICA        41                    1.10            +----------+---------------+----------+-----------+-------+ PCA P1          22                    1.01            +----------+---------------+----------+-----------+-------+ Opthalmic       25                    1.42            +----------+---------------+----------+-----------+-------+ ICA siphon      29                    0.94            +----------+---------------+----------+-----------+-------+ Vertebral       -22                   1.41            +----------+---------------+----------+-----------+-------+ Distal ICA      -38                   0.63            +----------+---------------+----------+-----------+-------+  +----------+--------------+----------+-----------+------------------+ LEFT TCD  Left VM (cm/s)Depth (cm)Pulsatility     Comment       +----------+--------------+----------+-----------+------------------+ MCA             58                   0.90                       +----------+--------------+----------+-----------+------------------+ ACA                                          unable to insonate +----------+--------------+----------+-----------+------------------+ Term ICA        48                   0.97                       +----------+--------------+----------+-----------+------------------+ PCA P1          28                   1.11                       +----------+--------------+----------+-----------+------------------+ Opthalmic       24                   1.20                       +----------+--------------+----------+-----------+------------------+ ICA siphon                                   unable to insonate  +----------+--------------+----------+-----------+------------------+ Vertebral      -24  0.90                       +----------+--------------+----------+-----------+------------------+ Distal ICA     -21                   1.14                       +----------+--------------+----------+-----------+------------------+  +------------+-------+-------+             VM cm/sComment +------------+-------+-------+ Prox Basilar  -35          +------------+-------+-------+ +----------------------+----+ Right Lindegaard Ratio1.37 +----------------------+----+ +---------------------+---+ Left Lindegaard Ratio2.8 +---------------------+---+  Summary: This was a normal transcranial Doppler study, with normal flow direction and velocity of all identified vessels of the anterior and posterior circulations, with no evidence of stenosis, vasospasm or occlusion. There was no evidence of intracranial disease. *See table(s) above for TCD measurements and observations.  Diagnosing physician: Eather Popp MD Electronically signed by Eather Popp MD on 09/12/2024 at 1:37:15 PM.    Final    VAS US  TRANSCRANIAL DOPPLER Result Date: 09/12/2024  Transcranial Doppler Patient Name:  Clinton Phillips  Date of Exam:   09/07/2024 Medical Rec #: [id number]        Accession #:    [id number] Date of Birth: Dec 31, 1960        Patient Gender: M Patient Age:   60 years Exam Location:  [facility] Procedure:      VAS US  TRANSCRANIAL DOPPLER Referring Phys: SAMMI GORE --------------------------------------------------------------------------------  Indications: Subarachnoid hemorrhage. History: Ruptured Acomm aneurysm s/p coil embolization. Limitations: Patient talking and movement Comparison Study: Previous exam on 09/06/2024 Performing Technologist: Ezzie Potters RVT, RDMS  Examination Guidelines: A complete evaluation includes B-mode imaging, spectral Doppler, color Doppler, and power Doppler as needed of  all accessible portions of each vessel. Bilateral testing is considered an integral part of a complete examination. Limited examinations for reoccurring indications may be performed as noted.  +----------+---------------+----------+-----------+------------------+ RIGHT TCD Right VM (cm/s)Depth (cm)Pulsatility     Comment       +----------+---------------+----------+-----------+------------------+ MCA             70                    1.36                       +----------+---------------+----------+-----------+------------------+ ACA                                           unable to insonate +----------+---------------+----------+-----------+------------------+ Term ICA        47                    1.41                       +----------+---------------+----------+-----------+------------------+ PCA P1          21                    1.09                       +----------+---------------+----------+-----------+------------------+ Opthalmic       17  1.42                       +----------+---------------+----------+-----------+------------------+ ICA siphon                                    unable to insonate +----------+---------------+----------+-----------+------------------+ Vertebral       -24                   0.90                       +----------+---------------+----------+-----------+------------------+ Distal ICA      31                    1.11                       +----------+---------------+----------+-----------+------------------+  +----------+--------------+----------+-----------+------------------+ LEFT TCD  Left VM (cm/s)Depth (cm)Pulsatility     Comment       +----------+--------------+----------+-----------+------------------+ MCA             53                   1.14                       +----------+--------------+----------+-----------+------------------+ ACA            -22                   0.84                        +----------+--------------+----------+-----------+------------------+ Term ICA        41                   1.12                       +----------+--------------+----------+-----------+------------------+ PCA P1          23                   1.06                       +----------+--------------+----------+-----------+------------------+ Opthalmic       16                   1.94                       +----------+--------------+----------+-----------+------------------+ ICA siphon                                   unable to insonate +----------+--------------+----------+-----------+------------------+ Vertebral      -23                   1.18                       +----------+--------------+----------+-----------+------------------+ Distal ICA      26                   1.23                       +----------+--------------+----------+-----------+------------------+  +------------+-------+------------------+  VM cm/s     Comment       +------------+-------+------------------+ Prox Basilar       unable to insonate +------------+-------+------------------+ Dist Basilar  -37         1.08        +------------+-------+------------------+ +----------------------+----+ Right Lindegaard Ratio2.26 +----------------------+----+ +---------------------+----+ Left Lindegaard Ratio2.04 +---------------------+----+  Summary:  Normal mean flow velocities in all identified vessels of anterior and posterior cerebral circulations. Globally elevated pulsatility indices suggest diffuse increase in intracranial pressure or atherosclerosis likely *See table(s) above for TCD measurements and observations.  Diagnosing physician: Eather Popp MD Electronically signed by Eather Popp MD on 09/12/2024 at 1:36:50 PM.    Final    CT HEAD WO CONTRAST ( ) Result Date: 09/12/2024 EXAM: CT HEAD WITHOUT CONTRAST 09/12/2024 07:44:10 AM TECHNIQUE: CT of the head was performed  without the administration of intravenous contrast. Automated exposure control, iterative reconstruction, and/or weight based adjustment of the mA/kV was utilized to reduce the radiation dose to as low as reasonably achievable. COMPARISON: CT of the head dated 09/09/2024. CLINICAL HISTORY: Subarachnoid hemorrhage [facility]). FINDINGS: BRAIN AND VENTRICLES: Significant interval improvement of bilateral subarachnoid hemorrhage. Marginal interval improvement of bilateral interventricular hemorrhage, which is seen largely independently within the occipital horns bilaterally. A right frontal approach ventriculostomy catheter remains with its tip near the left foramen of Monro. Embolization coils are again noted in the region of the anterior communicating artery. There is no evidence of interval hemorrhage or adverse change. A prominent cystic area is again demonstrated in the posterior fossa and eccentric to the left. No evidence of acute infarct. No mass effect or midline shift. ORBITS: No acute abnormality. SINUSES: No acute abnormality. SOFT TISSUES AND SKULL: No acute soft tissue abnormality. No skull fracture. IMPRESSION: 1. Significant interval improvement of bilateral subarachnoid hemorrhage and marginal interval improvement of bilateral interventricular hemorrhage. 2. No evidence of interval hemorrhage or adverse change. Electronically signed by: Evalene Coho MD 09/12/2024 08:05 AM EST RP Workstation: [id number]   CT HEAD WO CONTRAST ( ) Result Date: 09/09/2024 EXAM: CT HEAD WITHOUT CONTRAST 09/09/2024 01:42:36 PM TECHNIQUE: CT of the head was performed without the administration of intravenous contrast. Automated exposure control, iterative reconstruction, and/or weight based adjustment of the mA/kV was utilized to reduce the radiation dose to as low as reasonably achievable. COMPARISON: CT head 09/05/2024. CLINICAL HISTORY: Mental status change, unknown cause; neurosurgery order, may removed EVD. FINDINGS:  BRAIN AND VENTRICLES: Interval placement of right frontal approach ventriculostomy catheter which terminates in the left lateral ventricle near the foramen of Monro. 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 Decreased caliber of the lateral ventricles and third ventricle. Diffuse subarachnoid hemorrhage particularly along the sylvian fissures and interhemispheric fissure is similar to prior. There are slightly decreased series of hemorrhage over the frontal convexities likely related to redistribution of blood products. Small volume layering blood products in the occipital horns which are slightly increased from prior. No evidence of acute infarct. No extra-axial collection. No mass effect or midline shift. ORBITS: No acute abnormality. SINUSES: No acute abnormality. SOFT TISSUES AND SKULL: No acute soft tissue abnormality. No skull fracture. IMPRESSION: 1. Diffuse subarachnoid hemorrhage, particularly along the sylvian fissures and interhemispheric fissure, similar to prior. 2. Small volume layering blood products in the occipital horns, slightly increased from prior. 3. Interval placement of right frontal approach ventriculostomy catheter terminating in the left lateral ventricle near the foramen of Monro. 4. Decreased caliber of the lateral ventricles and third ventricle.  5. No midline shift. Electronically signed by: Donnice Mania MD 09/09/2024 01:53 PM EST RP Workstation: [id number]   DG CHEST PORT 1 VIEW Result Date: 09/08/2024 EXAM: 1 VIEW(S) XRAY OF THE CHEST 09/08/2024 08:38:00 AM COMPARISON: 09/05/2024 CLINICAL HISTORY: [id number] Fever [id number] FINDINGS: LUNGS AND PLEURA: No focal pulmonary opacity. No pleural effusion. No pneumothorax. HEART AND MEDIASTINUM: Atherosclerotic plaque. BONES AND SOFT TISSUES: No acute osseous abnormality. IMPRESSION: 1. No acute cardiopulmonary process identified. Electronically signed by: Norman Gatlin MD  09/08/2024 11:09 AM EST RP Workstation: [id number]   VAS US  TRANSCRANIAL DOPPLER Result Date: 09/07/2024  Transcranial Doppler Patient Name:  Clinton Phillips  Date of Exam:   09/06/2024 Medical Rec #: [id number]        Accession #:    [id number] Date of Birth: 1961/01/22        Patient Gender: M Patient Age:   6 years Exam Location:  [facility] Procedure:      VAS US  TRANSCRANIAL DOPPLER Referring Phys: SAMMI GORE --------------------------------------------------------------------------------  Indications: Subarachnoid hemorrhage. History: Ruptured Acomm aneurysm s/p coil embolization. Comparison Study: No previous exams Performing Technologist: Jody Hill RVT, RDMS  Examination Guidelines: A complete evaluation includes B-mode imaging, spectral Doppler, color Doppler, and power Doppler as needed of all accessible portions of each vessel. Bilateral testing is considered an integral part of a complete examination. Limited examinations for reoccurring indications may be performed as noted.  +----------+---------------+----------+-----------+------------------+ RIGHT TCD Right VM (cm/s)Depth (cm)Pulsatility     Comment       +----------+---------------+----------+-----------+------------------+ MCA             69                    0.98                       +----------+---------------+----------+-----------+------------------+ ACA                                           unable to insonate +----------+---------------+----------+-----------+------------------+ Term ICA        39                    1.25                       +----------+---------------+----------+-----------+------------------+ PCA P1          16                    0.66                       +----------+---------------+----------+-----------+------------------+ Opthalmic       15                    1.59                       +----------+---------------+----------+-----------+------------------+ ICA  siphon                                    unable to insonate +----------+---------------+----------+-----------+------------------+ Vertebral       -14                   0.89                       +----------+---------------+----------+-----------+------------------+  Distal ICA      26                    1.16                       +----------+---------------+----------+-----------+------------------+  +----------+--------------+----------+-----------+------------------+ LEFT TCD  Left VM (cm/s)Depth (cm)Pulsatility     Comment       +----------+--------------+----------+-----------+------------------+ MCA             44                   0.93                       +----------+--------------+----------+-----------+------------------+ ACA            -22                   0.80                       +----------+--------------+----------+-----------+------------------+ Term ICA        32                   1.03                       +----------+--------------+----------+-----------+------------------+ PCA P1          18                   0.77                       +----------+--------------+----------+-----------+------------------+ Opthalmic       11                   1.98                       +----------+--------------+----------+-----------+------------------+ ICA siphon                                   unable to insonate +----------+--------------+----------+-----------+------------------+ Vertebral      -23                   0.84                       +----------+--------------+----------+-----------+------------------+ Distal ICA      28                   0.85                       +----------+--------------+----------+-----------+------------------+  +------------+-------+-------+             VM cm/sComment +------------+-------+-------+ Prox Basilar  -26   1.14   +------------+-------+-------+ Dist Basilar  -38   1.00    +------------+-------+-------+ +----------------------+----+ Right Lindegaard Ratio2.65 +----------------------+----+ +---------------------+----+ Left Lindegaard Ratio1.57 +---------------------+----+  Summary: This was a normal transcranial Doppler study, with normal flow direction and velocity of all identified vessels of the anterior and posterior circulations, with no evidence of stenosis, vasospasm or occlusion. There was no evidence of intracranial disease. *See table(s) above for TCD measurements and observations.  Diagnosing physician: Eather Popp MD Electronically signed by Eather Popp MD on 09/07/2024 at 7:57:41 AM.    Final    IR Transcath/Emboliz  Result Date: 09/06/2024 PROCEDURE: Cerebral arteriogram, aneurysm embolization HISTORY: Subarachnoid hemorrhage ACCESS: Right Radial ANESTHESIA/SEDATION: General MEDICATIONS: HEPARIN : 6,000 units ACT was 147 seconds after 5000 units CONTRAST:  70 mL Isovue-300 FLUOROSCOPY: 13:54 minutes 1176 mGy reference air Kerma PROCEDURE: The right radial artery was punctured using ultrasound guidance with direct visualization of the needle entering the radial artery. A 7 French sheath was placed. A mixture of 2.5 Mg verapamil  and 100 mcg nitroglycerin  were then administered intra-arterially, followed by 5,000 units of heparin  intravenously. An additional 1000 units were administered after checking the ACT. A Simmons 2 catheter was used for selective catheterization and injection of the left common carotid artery for study of the cervical and cerebral circulation. Catheter was then placed into the right common carotid artery for study of the cervical and cerebral circulation. 5 mg of verapamil  were administered intra-arterially, and the catheter was then advanced into the internal carotid artery for further study of the cerebral circulation in definition of the aneurysm anatomy with three-dimensional angiography. The angiogram demonstrated 2 aneurysms. There is  a bilobed aneurysm arising at the A1-A2 junction of a dominant right A1 and azygos A2 segment. The aneurysm is irregular with a approximate 2.5 mm lobe and a 1.5 mm lobe. This aneurysm was thought to rupture. More distally there is a 2.5 mm aneurysm arising at the frontal polar branch on the right side. This aneurysm is smooth and was thought more likely incidental. A benchmark catheter was then advanced over the [facility] 2 catheter into the internal carotid artery. A Berenstein shaped catheter was used to advance the benchmark catheter atraumatically into the cavernous segment. The bilobed aneurysm was then catheterized with a straight echelon 10 microcatheter. The initial coil placed was a 2 x 3.5 tetra coil, followed by 1.5 x 2 tetra coil. The second lobe of the aneurysm was then catheterized with a 45 degree echelon 10 microcatheter and a 1.5 x 2 tetra coil was placed. The catheter was then removed. A follow-up arteriogram was done from the internal carotid artery to assess the result of the treatment and any complications. Hemostasis was obtained with TR band. FINDINGS: Left common carotid cervical and intracranial: The carotid bifurcation is normal. The intracranial images demonstrate a hypoplastic A1 segment. The internal carotid artery and middle cerebral arteries are normal. Right common carotid: The carotid bifurcation is normal. Right internal carotid intracranial: There is an azygos A2 segment of the anterior cerebral arteries. Arising from the left side there is a bilobed aneurysm, with the common neck. The larger lobe measures about 2 mm and the smaller lobe about 1.5 mm. 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 There is normal flow in the internal carotid artery, anterior and middle cerebral artery territories with no evidence of dissection, vessel  embolus or other complication IMPRESSION: 1. Dominant right A1 with azygos A2 segment 2. 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 This aneurysm was thought to be the aneurysm that had ruptured 3. 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 Coil embolization of the bilobed aneurysm without complication Codes: 23062, 38375, 63776, 63775, 24101 Electronically Signed   By: Nancyann Burns M.D.   On: 09/06/2024 09:36   IR Angiogram Follow Up Study Result Date: 09/06/2024 PROCEDURE: Cerebral arteriogram, aneurysm embolization HISTORY: Subarachnoid hemorrhage ACCESS: Right Radial ANESTHESIA/SEDATION: General  MEDICATIONS: HEPARIN : 6,000 units ACT was 147 seconds after 5000 units CONTRAST:  70 mL Isovue-300 FLUOROSCOPY: 13:54 minutes 1176 mGy reference air Kerma PROCEDURE: The right radial artery was punctured using ultrasound guidance with direct visualization of the needle entering the radial artery. A 7 French sheath was placed. A mixture of 2.5 Mg verapamil  and 100 mcg nitroglycerin  were then administered intra-arterially, followed by 5,000 units of heparin  intravenously. An additional 1000 units were administered after checking the ACT. A Simmons 2 catheter was used for selective catheterization and injection of the left common carotid artery for study of the cervical and cerebral circulation. Catheter was then placed into the right common carotid artery for study of the cervical and cerebral circulation. 5 mg of verapamil  were administered intra-arterially, and the catheter was then advanced into the internal carotid artery for further study of the cerebral circulation in definition of the aneurysm anatomy with three-dimensional angiography. The angiogram demonstrated 2 aneurysms. 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 The aneurysm is irregular with a approximate 2.5 mm lobe and a 1.5  mm lobe. This aneurysm was thought to rupture. More distally there is a 2.5 mm aneurysm arising at the frontal polar branch on the right side. This aneurysm is smooth and was thought more likely incidental. A benchmark catheter was then advanced over the [facility] 2 catheter into the internal carotid artery. A Berenstein shaped catheter was used to advance the benchmark catheter atraumatically into the cavernous segment. The bilobed aneurysm was then catheterized with a straight echelon 10 microcatheter. The initial coil placed was a 2 x 3.5 tetra coil, followed by 1.5 x 2 tetra coil. The second lobe of the aneurysm was then catheterized with a 45 degree echelon 10 microcatheter and a 1.5 x 2 tetra coil was placed. The catheter was then removed. A follow-up arteriogram was done from the internal carotid artery to assess the result of the treatment and any complications. Hemostasis was obtained with TR band. FINDINGS: Left common carotid cervical and intracranial: The carotid bifurcation is normal. The intracranial images demonstrate a hypoplastic A1 segment. The internal carotid artery and middle cerebral arteries are normal. Right common carotid: The carotid bifurcation is normal. Right internal carotid intracranial: There is an azygos A2 segment of the anterior cerebral arteries. Arising from the left side there is a bilobed aneurysm, with the common neck. The larger lobe measures about 2 mm and the smaller lobe about 1.5 mm. 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 There is normal flow in the internal carotid artery, anterior and middle cerebral artery territories with no evidence of dissection, vessel embolus or other complication IMPRESSION: 1. Dominant right A1 with azygos A2 segment 2. 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 This aneurysm was thought  to be the aneurysm that had ruptured 3. 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 Coil embolization of the bilobed aneurysm without complication Codes: 23062, 38375, 63776, 63775, 24101 Electronically Signed   By: Nancyann Burns M.D.   On: 09/06/2024 09:36   IR Angiogram Follow Up Study Result Date: 09/06/2024 PROCEDURE: Cerebral arteriogram, aneurysm embolization HISTORY: Subarachnoid hemorrhage ACCESS: Right Radial ANESTHESIA/SEDATION: General MEDICATIONS: HEPARIN : 6,000 units ACT was 147 seconds after 5000 units CONTRAST:  70 mL Isovue-300  FLUOROSCOPY: 13:54 minutes 1176 mGy reference air Kerma PROCEDURE: The right radial artery was punctured using ultrasound guidance with direct visualization of the needle entering the radial artery. A 7 French sheath was placed. A mixture of 2.5 Mg verapamil  and 100 mcg nitroglycerin  were then administered intra-arterially, followed by 5,000 units of heparin  intravenously. An additional 1000 units were administered after checking the ACT. A Simmons 2 catheter was used for selective catheterization and injection of the left common carotid artery for study of the cervical and cerebral circulation. Catheter was then placed into the right common carotid artery for study of the cervical and cerebral circulation. 5 mg of verapamil  were administered intra-arterially, and the catheter was then advanced into the internal carotid artery for further study of the cerebral circulation in definition of the aneurysm anatomy with three-dimensional angiography. The angiogram demonstrated 2 aneurysms. 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 The aneurysm is irregular with a approximate 2.5 mm lobe and a 1.5 mm lobe. This aneurysm was thought to rupture. More distally there is a 2.5 mm aneurysm arising at the frontal polar branch on the right side. This aneurysm is  smooth and was thought more likely incidental. A benchmark catheter was then advanced over the [facility] 2 catheter into the internal carotid artery. A Berenstein shaped catheter was used to advance the benchmark catheter atraumatically into the cavernous segment. The bilobed aneurysm was then catheterized with a straight echelon 10 microcatheter. The initial coil placed was a 2 x 3.5 tetra coil, followed by 1.5 x 2 tetra coil. The second lobe of the aneurysm was then catheterized with a 45 degree echelon 10 microcatheter and a 1.5 x 2 tetra coil was placed. The catheter was then removed. A follow-up arteriogram was done from the internal carotid artery to assess the result of the treatment and any complications. Hemostasis was obtained with TR band. FINDINGS: Left common carotid cervical and intracranial: The carotid bifurcation is normal. The intracranial images demonstrate a hypoplastic A1 segment. The internal carotid artery and middle cerebral arteries are normal. Right common carotid: The carotid bifurcation is normal. Right internal carotid intracranial: There is an azygos A2 segment of the anterior cerebral arteries. Arising from the left side there is a bilobed aneurysm, with the common neck. The larger lobe measures about 2 mm and the smaller lobe about 1.5 mm. 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 There is normal flow in the internal carotid artery, anterior and middle cerebral artery territories with no evidence of dissection, vessel embolus or other complication IMPRESSION: 1. Dominant right A1 with azygos A2 segment 2. 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 This aneurysm was thought to be the aneurysm that had ruptured 3. 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 Coil  embolization of the bilobed aneurysm without complication Codes: 23062, 38375, 63776, 63775, 24101 Electronically Signed   By: Nancyann Burns M.D.   On: 09/06/2024 09:36   IR ANGIO INTRA EXTRACRAN SEL COM CAROTID INNOMINATE UNI L MOD SED Result Date: 09/06/2024 PROCEDURE: Cerebral arteriogram, aneurysm embolization HISTORY: Subarachnoid hemorrhage ACCESS: Right Radial ANESTHESIA/SEDATION: General MEDICATIONS: HEPARIN : 6,000 units ACT was 147 seconds after 5000 units CONTRAST:  70 mL Isovue-300 FLUOROSCOPY: 13:54 minutes 1176 mGy reference air Kerma PROCEDURE:  The right radial artery was punctured using ultrasound guidance with direct visualization of the needle entering the radial artery. A 7 French sheath was placed. A mixture of 2.5 Mg verapamil  and 100 mcg nitroglycerin  were then administered intra-arterially, followed by 5,000 units of heparin  intravenously. An additional 1000 units were administered after checking the ACT. A Simmons 2 catheter was used for selective catheterization and injection of the left common carotid artery for study of the cervical and cerebral circulation. Catheter was then placed into the right common carotid artery for study of the cervical and cerebral circulation. 5 mg of verapamil  were administered intra-arterially, and the catheter was then advanced into the internal carotid artery for further study of the cerebral circulation in definition of the aneurysm anatomy with three-dimensional angiography. The angiogram demonstrated 2 aneurysms. 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 The aneurysm is irregular with a approximate 2.5 mm lobe and a 1.5 mm lobe. This aneurysm was thought to rupture. More distally there is a 2.5 mm aneurysm arising at the frontal polar branch on the right side. This aneurysm is smooth and was thought more likely incidental. A benchmark catheter was then advanced over the [facility] 2 catheter into  the internal carotid artery. A Berenstein shaped catheter was used to advance the benchmark catheter atraumatically into the cavernous segment. The bilobed aneurysm was then catheterized with a straight echelon 10 microcatheter. The initial coil placed was a 2 x 3.5 tetra coil, followed by 1.5 x 2 tetra coil. The second lobe of the aneurysm was then catheterized with a 45 degree echelon 10 microcatheter and a 1.5 x 2 tetra coil was placed. The catheter was then removed. A follow-up arteriogram was done from the internal carotid artery to assess the result of the treatment and any complications. Hemostasis was obtained with TR band. FINDINGS: Left common carotid cervical and intracranial: The carotid bifurcation is normal. The intracranial images demonstrate a hypoplastic A1 segment. The internal carotid artery and middle cerebral arteries are normal. Right common carotid: The carotid bifurcation is normal. Right internal carotid intracranial: There is an azygos A2 segment of the anterior cerebral arteries. Arising from the left side there is a bilobed aneurysm, with the common neck. The larger lobe measures about 2 mm and the smaller lobe about 1.5 mm. 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 There is normal flow in the internal carotid artery, anterior and middle cerebral artery territories with no evidence of dissection, vessel embolus or other complication IMPRESSION: 1. Dominant right A1 with azygos A2 segment 2. 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 This aneurysm was thought to be the aneurysm that had ruptured 3. 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 Coil embolization of the bilobed aneurysm without complication Codes: 23062, 38375, 63776, 63775, 24101 Electronically Signed   By:  Nancyann Burns M.D.   On: 09/06/2024 09:36   IR Angiogram Follow Up Study Result Date: 09/06/2024 PROCEDURE: Cerebral arteriogram, aneurysm embolization HISTORY: Subarachnoid hemorrhage ACCESS: Right Radial ANESTHESIA/SEDATION: General MEDICATIONS: HEPARIN : 6,000 units ACT was 147 seconds after 5000 units CONTRAST:  70 mL Isovue-300 FLUOROSCOPY: 13:54 minutes 1176 mGy reference air Kerma PROCEDURE: The right radial artery was punctured using ultrasound guidance with direct visualization of the needle entering  the radial artery. A 7 French sheath was placed. A mixture of 2.5 Mg verapamil  and 100 mcg nitroglycerin  were then administered intra-arterially, followed by 5,000 units of heparin  intravenously. An additional 1000 units were administered after checking the ACT. A Simmons 2 catheter was used for selective catheterization and injection of the left common carotid artery for study of the cervical and cerebral circulation. Catheter was then placed into the right common carotid artery for study of the cervical and cerebral circulation. 5 mg of verapamil  were administered intra-arterially, and the catheter was then advanced into the internal carotid artery for further study of the cerebral circulation in definition of the aneurysm anatomy with three-dimensional angiography. The angiogram demonstrated 2 aneurysms. 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 The aneurysm is irregular with a approximate 2.5 mm lobe and a 1.5 mm lobe. This aneurysm was thought to rupture. More distally there is a 2.5 mm aneurysm arising at the frontal polar branch on the right side. This aneurysm is smooth and was thought more likely incidental. A benchmark catheter was then advanced over the [facility] 2 catheter into the internal carotid artery. A Berenstein shaped catheter was used to advance the benchmark catheter atraumatically into the cavernous segment. The bilobed  aneurysm was then catheterized with a straight echelon 10 microcatheter. The initial coil placed was a 2 x 3.5 tetra coil, followed by 1.5 x 2 tetra coil. The second lobe of the aneurysm was then catheterized with a 45 degree echelon 10 microcatheter and a 1.5 x 2 tetra coil was placed. The catheter was then removed. A follow-up arteriogram was done from the internal carotid artery to assess the result of the treatment and any complications. Hemostasis was obtained with TR band. FINDINGS: Left common carotid cervical and intracranial: The carotid bifurcation is normal. The intracranial images demonstrate a hypoplastic A1 segment. The internal carotid artery and middle cerebral arteries are normal. Right common carotid: The carotid bifurcation is normal. Right internal carotid intracranial: There is an azygos A2 segment of the anterior cerebral arteries. Arising from the left side there is a bilobed aneurysm, with the common neck. The larger lobe measures about 2 mm and the smaller lobe about 1.5 mm. 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 There is normal flow in the internal carotid artery, anterior and middle cerebral artery territories with no evidence of dissection, vessel embolus or other complication IMPRESSION: 1. Dominant right A1 with azygos A2 segment 2. 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 This aneurysm was thought to be the aneurysm that had ruptured 3. 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 Coil embolization of the bilobed aneurysm without complication Codes: 23062, 38375, 63776, 63775, 24101 Electronically Signed   By: Nancyann Burns M.D.   On: 09/06/2024 09:36   IR CT Head Ltd Result Date: 09/06/2024 PROCEDURE: Cerebral arteriogram, aneurysm embolization HISTORY:  Subarachnoid hemorrhage ACCESS: Right Radial ANESTHESIA/SEDATION: General MEDICATIONS: HEPARIN : 6,000 units ACT was 147 seconds after 5000 units CONTRAST:  70 mL Isovue-300 FLUOROSCOPY: 13:54 minutes 1176 mGy reference air Kerma PROCEDURE: The right radial artery was punctured using ultrasound guidance with direct visualization of the needle entering the radial artery. A 7 French sheath was placed. A mixture of 2.5 Mg verapamil  and 100  mcg nitroglycerin  were then administered intra-arterially, followed by 5,000 units of heparin  intravenously. An additional 1000 units were administered after checking the ACT. A Simmons 2 catheter was used for selective catheterization and injection of the left common carotid artery for study of the cervical and cerebral circulation. Catheter was then placed into the right common carotid artery for study of the cervical and cerebral circulation. 5 mg of verapamil  were administered intra-arterially, and the catheter was then advanced into the internal carotid artery for further study of the cerebral circulation in definition of the aneurysm anatomy with three-dimensional angiography. The angiogram demonstrated 2 aneurysms. 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 The aneurysm is irregular with a approximate 2.5 mm lobe and a 1.5 mm lobe. This aneurysm was thought to rupture. More distally there is a 2.5 mm aneurysm arising at the frontal polar branch on the right side. This aneurysm is smooth and was thought more likely incidental. A benchmark catheter was then advanced over the [facility] 2 catheter into the internal carotid artery. A Berenstein shaped catheter was used to advance the benchmark catheter atraumatically into the cavernous segment. The bilobed aneurysm was then catheterized with a straight echelon 10 microcatheter. The initial coil placed was a 2 x 3.5 tetra coil, followed by 1.5 x 2 tetra coil. The second lobe  of the aneurysm was then catheterized with a 45 degree echelon 10 microcatheter and a 1.5 x 2 tetra coil was placed. The catheter was then removed. A follow-up arteriogram was done from the internal carotid artery to assess the result of the treatment and any complications. Hemostasis was obtained with TR band. FINDINGS: Left common carotid cervical and intracranial: The carotid bifurcation is normal. The intracranial images demonstrate a hypoplastic A1 segment. The internal carotid artery and middle cerebral arteries are normal. Right common carotid: The carotid bifurcation is normal. Right internal carotid intracranial: There is an azygos A2 segment of the anterior cerebral arteries. Arising from the left side there is a bilobed aneurysm, with the common neck. The larger lobe measures about 2 mm and the smaller lobe about 1.5 mm. 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 There is normal flow in the internal carotid artery, anterior and middle cerebral artery territories with no evidence of dissection, vessel embolus or other complication IMPRESSION: 1. Dominant right A1 with azygos A2 segment 2. 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 This aneurysm was thought to be the aneurysm that had ruptured 3. 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 Coil embolization of the bilobed aneurysm without complication Codes: 23062, 38375, 63776, 63775, 24101 Electronically Signed   By: Nancyann Burns M.D.   On: 09/06/2024 09:36   IR 3D Independent [place] Result Date: 09/06/2024 PROCEDURE: Cerebral arteriogram, aneurysm embolization HISTORY: Subarachnoid hemorrhage ACCESS: Right Radial ANESTHESIA/SEDATION: General MEDICATIONS: HEPARIN : 6,000 units ACT was 147 seconds after 5000 units CONTRAST:  70 mL  Isovue-300 FLUOROSCOPY: 13:54 minutes 1176 mGy reference air Kerma PROCEDURE: The right radial artery was punctured using ultrasound guidance with direct visualization of the needle entering the radial artery. A 7 French sheath was placed. A mixture of 2.5 Mg verapamil  and 100 mcg nitroglycerin  were then administered intra-arterially, followed by 5,000 units of heparin  intravenously. An additional 1000 units  were administered after checking the ACT. A Simmons 2 catheter was used for selective catheterization and injection of the left common carotid artery for study of the cervical and cerebral circulation. Catheter was then placed into the right common carotid artery for study of the cervical and cerebral circulation. 5 mg of verapamil  were administered intra-arterially, and the catheter was then advanced into the internal carotid artery for further study of the cerebral circulation in definition of the aneurysm anatomy with three-dimensional angiography. The angiogram demonstrated 2 aneurysms. 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 The aneurysm is irregular with a approximate 2.5 mm lobe and a 1.5 mm lobe. This aneurysm was thought to rupture. More distally there is a 2.5 mm aneurysm arising at the frontal polar branch on the right side. This aneurysm is smooth and was thought more likely incidental. A benchmark catheter was then advanced over the [facility] 2 catheter into the internal carotid artery. A Berenstein shaped catheter was used to advance the benchmark catheter atraumatically into the cavernous segment. The bilobed aneurysm was then catheterized with a straight echelon 10 microcatheter. The initial coil placed was a 2 x 3.5 tetra coil, followed by 1.5 x 2 tetra coil. The second lobe of the aneurysm was then catheterized with a 45 degree echelon 10 microcatheter and a 1.5 x 2 tetra coil was placed. The catheter was then removed. A follow-up  arteriogram was done from the internal carotid artery to assess the result of the treatment and any complications. Hemostasis was obtained with TR band. FINDINGS: Left common carotid cervical and intracranial: The carotid bifurcation is normal. The intracranial images demonstrate a hypoplastic A1 segment. The internal carotid artery and middle cerebral arteries are normal. Right common carotid: The carotid bifurcation is normal. Right internal carotid intracranial: There is an azygos A2 segment of the anterior cerebral arteries. Arising from the left side there is a bilobed aneurysm, with the common neck. The larger lobe measures about 2 mm and the smaller lobe about 1.5 mm. 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 There is normal flow in the internal carotid artery, anterior and middle cerebral artery territories with no evidence of dissection, vessel embolus or other complication IMPRESSION: 1. Dominant right A1 with azygos A2 segment 2. 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 This aneurysm was thought to be the aneurysm that had ruptured 3. 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 Coil embolization of the bilobed aneurysm without complication Codes: 23062, 38375, 63776, 63775, 24101 Electronically Signed   By: Nancyann Burns M.D.   On: 09/06/2024 09:36   IR US  Guide Vasc Access Right Result Date: 09/06/2024 PROCEDURE: Cerebral arteriogram, aneurysm embolization HISTORY: Subarachnoid hemorrhage ACCESS: Right Radial ANESTHESIA/SEDATION: General MEDICATIONS: HEPARIN : 6,000 units ACT was 147 seconds after 5000 units CONTRAST:  70 mL Isovue-300 FLUOROSCOPY: 13:54 minutes 1176 mGy reference air Kerma PROCEDURE: The right radial artery was punctured using ultrasound guidance with direct  visualization of the needle entering the radial artery. A 7 French sheath was placed. A mixture of 2.5 Mg verapamil  and 100 mcg nitroglycerin  were then administered intra-arterially, followed by 5,000 units of heparin  intravenously. An additional 1000 units were administered after checking the ACT. A Simmons 2 catheter was used for selective catheterization  and injection of the left common carotid artery for study of the cervical and cerebral circulation. Catheter was then placed into the right common carotid artery for study of the cervical and cerebral circulation. 5 mg of verapamil  were administered intra-arterially, and the catheter was then advanced into the internal carotid artery for further study of the cerebral circulation in definition of the aneurysm anatomy with three-dimensional angiography. The angiogram demonstrated 2 aneurysms. 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 The aneurysm is irregular with a approximate 2.5 mm lobe and a 1.5 mm lobe. This aneurysm was thought to rupture. More distally there is a 2.5 mm aneurysm arising at the frontal polar branch on the right side. This aneurysm is smooth and was thought more likely incidental. A benchmark catheter was then advanced over the [facility] 2 catheter into the internal carotid artery. A Berenstein shaped catheter was used to advance the benchmark catheter atraumatically into the cavernous segment. The bilobed aneurysm was then catheterized with a straight echelon 10 microcatheter. The initial coil placed was a 2 x 3.5 tetra coil, followed by 1.5 x 2 tetra coil. The second lobe of the aneurysm was then catheterized with a 45 degree echelon 10 microcatheter and a 1.5 x 2 tetra coil was placed. The catheter was then removed. A follow-up arteriogram was done from the internal carotid artery to assess the result of the treatment and any complications. Hemostasis was obtained with TR band.  FINDINGS: Left common carotid cervical and intracranial: The carotid bifurcation is normal. The intracranial images demonstrate a hypoplastic A1 segment. The internal carotid artery and middle cerebral arteries are normal. Right common carotid: The carotid bifurcation is normal. Right internal carotid intracranial: There is an azygos A2 segment of the anterior cerebral arteries. Arising from the left side there is a bilobed aneurysm, with the common neck. The larger lobe measures about 2 mm and the smaller lobe about 1.5 mm. 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 There is normal flow in the internal carotid artery, anterior and middle cerebral artery territories with no evidence of dissection, vessel embolus or other complication IMPRESSION: 1. Dominant right A1 with azygos A2 segment 2. 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 This aneurysm was thought to be the aneurysm that had ruptured 3. 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 Coil embolization of the bilobed aneurysm without complication Codes: 23062, 38375, 63776, 63775, 24101 Electronically Signed   By: Nancyann Burns M.D.   On: 09/06/2024 09:36   DG Abd 1 View Result Date: 09/05/2024 EXAM: 1 VIEW XRAY OF THE ABDOMEN 09/05/2024 11:31:00 PM COMPARISON: 09/05/2024 CLINICAL HISTORY: Encounter for orogastric (OG) tube placement. FINDINGS: LINES, TUBES AND DEVICES: Orogastric tube in place with tip and side hole overlying the left upper quadrant and stomach. BOWEL: Nonobstructive bowel gas pattern. SOFT TISSUES: Calcification in the right upper quadrant corresponds to a peripherally calcified cyst in the kidney. No opaque urinary calculi. BONES: No acute osseous abnormality. IMPRESSION: 1. Orogastric tube with tip and side hole over the  stomach in the left upper quadrant. Electronically signed by: Luke Bun MD 09/05/2024 11:56 PM EST RP Workstation: [id number]   DG Abd 1 View Result Date: 09/05/2024 EXAM: 1 VIEW XRAY OF THE ABDOMEN 09/05/2024 10:28:00  PM COMPARISON: None available. CLINICAL HISTORY: check gastric catheter placement. FINDINGS: LINES, TUBES AND DEVICES: The gastric catheter is not visualized on this exam raising suspicion for coiling within the esophagus or pharynx. BOWEL: Nonobstructive bowel gas pattern. No free air is seen. SOFT TISSUES: Calcification in the right upper quadrant is noted related to collapsed calcified renal cyst similar to that seen in 2022. No opaque urinary calculi. BONES: No acute osseous abnormality. IMPRESSION: 1. Gastric catheter not visualized, raising suspicion for malposition with possible coiling in the esophagus or pharynx. Chest x-ray may be helpful for further evaluation. Electronically signed by: Oneil Devonshire MD 09/05/2024 10:38 PM EST RP Workstation: [id number]   DG Chest Portable 1 View Result Date: 09/05/2024 EXAM: 1 VIEW(S) XRAY OF THE CHEST 09/05/2024 12:03:00 PM COMPARISON: Earlier today at 11:13 AM. CLINICAL HISTORY: post intubation FINDINGS: LINES, TUBES AND DEVICES: Endotracheal tube terminates 5.2 cm above carina. Multiple wires and leads project over the chest on the frontal radiograph. LUNGS AND PLEURA: Moderate pulmonary interstitial prominence appears increased since earlier today. There is a somewhat more confluent possible developing right upper lobe opacity. No pleural effusion. No pneumothorax. HEART AND MEDIASTINUM: Mild cardiomegaly. Transverse aortic atherosclerosis. BONES AND SOFT TISSUES: No acute osseous abnormality. IMPRESSION: 1. Endotracheal tube appropriately positioned, 5.2 cm above carina. 2. Moderate pulmonary interstitial thickening/coarsening, likely related to smoking. cannot exclude developing more confluent right upper lobe opacity as can be seen with  aspiration. Recommend short-term radiographic follow-up. 3. Aortic atherosclerosis. Electronically signed by: Rockey Kilts MD 09/05/2024 12:45 PM EST RP Workstation: [id number]   CT Angio Head Neck W WO CM Addendum Date: 09/05/2024 ** ADDENDUM #1 ** ADDENDUM: Study discussed by telephone with Doctor Mumma at 12:10 hours on 09/05/2024. ---------------------------------------------------- Electronically signed by: Helayne Hurst MD 09/05/2024 12:15 PM EST RP Workstation: [id number]   Result Date: 09/05/2024 ** ORIGINAL REPORT ** EXAM: CTA HEAD AND NECK WITHOUT AND WITH 09/05/2024 11:53:30 AM TECHNIQUE: CTA of the head and neck was performed without and with the administration of 75 mL of iohexol  (OMNIPAQUE ) 350 MG/ML injection. Multiplanar 2D and/or 3D reformatted images are provided for review. Automated exposure control, iterative reconstruction, and/or weight based adjustment of the mA/kV was utilized to reduce the radiation dose to as low as reasonably achievable. Stenosis of the internal carotid arteries measured using NASCET criteria. COMPARISON: Head CT 09/05/2024 11:08 AM CLINICAL HISTORY: 63 year old male with acute subarachnoid hemorrhage on head CT. FINDINGS: CTA NECK: AORTIC ARCH AND ARCH VESSELS: Tortuous aortic arch with moderate arch calcified atherosclerosis. 3.5 cm arch configuration. Mild brachiocephalic artery atherosclerosis without stenosis. Proximal right subclavian artery calcified plaque without stenosis. Mild left subclavian origin plaque without stenosis. No dissection or arterial injury. CERVICAL CAROTID ARTERIES: Mildly tortuous proximal right CCA. Soft and calcified atherosclerosis at the posterior right ICA origin and bulb without stenosis. Tortuous right ICA below the skull base. Negative left CCA. Mild calcified plaque at the left carotid bifurcation. Mildly tortuous cervical left ICA. No dissection or arterial injury. No hemodynamically significant stenosis by NASCET criteria.  CERVICAL VERTEBRAL ARTERIES: Right vertebral artery origin not well visualized, possibly due to right subclavian venous contrast streak artifact. Right vertebral appears nondominant and diminutive in the mid and distal V2 segment. Right vertebral artery is patent to the vertebrobasilar junction with normal PICA origin and no stenosis. Left vertebral artery appears dominant. Normal left vertebral artery origin. No left vertebral artery stenosis. Normal left PICA origin. No dissection or arterial injury. LUNGS AND MEDIASTINUM: Paraseptal and centrilobular emphysema. Mildly asymmetric  upper lobe lung opacity appears to be atelectasis. Negative visible superior mediastinum. SOFT TISSUES: Small volume retained or aspirated secretions in the hypopharynx on series 7 image 118. Otherwise negative nonvascular neck soft tissue spaces. BONES: No narrowing of the cervical spine degeneration. No acute abnormality. CTA HEAD: Adequate but suboptimal intracranial artery contrast bolus. ANTERIOR CIRCULATION: The supraclinoid right ICA is mildly ectatic. Right ICA siphon is patent, with moderate cavernous segment calcified plaque. No right siphon stenosis or aneurysm identified. Patent left ICA siphon with mild to moderate cavernous segment calcified plaque. No left siphon stenosis or aneurysm identified. The right MCA and ACA origin are normal. Normal MCA origins. The right ACA A1 segment appears dominant, the left is diminutive. There is evidence of a small inferiorly and posteriorly directed anterior communicating artery aneurysm on series 9 image 125 and series 11 image 256. This measures approximately 3 to 4 mm. Beyond that azygos ACA A2 segment anatomy. Otherwise bilateral ACA branches are within normal limits. Bilateral MCA branches are within normal limits. No other intracranial aneurysm identified. POSTERIOR CIRCULATION: No basilar artery stenosis or aneurysm identified. SCA and PCA origins appear normal. Diminutive or  absent posterior communicating arteries. Bilateral PCA branches are within normal limits. No stenosis of the vertebral arteries. No aneurysm. OTHER: Major dural venous sinuses are enhancing and appear to be patent. IMPRESSION: 1. 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 2. No no other intracranial aneurysm identified. 3. Cavernous ICA, carotid bifurcation, and aortic arch calcified atherosclerosis. No hemodynamically significant stenosis identified. 4. Emphysema. Electronically signed by: Helayne Hurst MD 09/05/2024 12:11 PM EST RP Workstation: [id number]   DG Chest Portable 1 View Result Date: 09/05/2024 CLINICAL DATA:  Altered mental status. EXAM: PORTABLE CHEST 1 VIEW COMPARISON:  Radiographs 08/05/2017 and 01/02/2015. FINDINGS: 1113 hours. Lower lung volumes with mild patient rotation to the right. Allowing for this, the heart size and mediastinal contours are grossly stable. There is mild atelectasis at both lung bases. No confluent airspace disease, pleural effusion or pneumothorax. No acute osseous findings are seen. Telemetry leads overlie the chest. IMPRESSION: Lower lung volumes with mild bibasilar atelectasis. No acute cardiopulmonary process. Electronically Signed   By: Elsie Perone M.D.   On: 09/05/2024 11:39   CT Head Wo Contrast Result Date: 09/05/2024 EXAM: CT HEAD WITHOUT CONTRAST 09/05/2024 11:11:06 AM TECHNIQUE: CT of the head was performed without the administration of intravenous contrast. Automated exposure control, iterative reconstruction, and/or weight based adjustment of the mA/kV was utilized to reduce the radiation dose to as low as reasonably achievable. COMPARISON: None available. CLINICAL HISTORY: Mental status change, unknown cause. FINDINGS: BRAIN AND VENTRICLES: There is a moderate amount  of acute subarachnoid hemorrhage involving the sylvian fissures and convexity cerebral sulci bilaterally as well as along the interhemispheric fissure. A small amount of intraventricular hemorrhage is noted in the occipital horns of the lateral ventricles, and the lateral and third ventricles are mildly dilated including dilatation of the temporal horns. A 5.5 x 3 cm enlarged CSF space posterior to the cerebellum likely reflects an arachnoid cyst. No acute large territory infarct, midline shift, or extra axial fluid collection is identified. Calcified atherosclerosis at the skull base. ORBITS: Chronic deformity of the right lamina papyracea. SINUSES: No acute abnormality. SOFT TISSUES AND SKULL: No acute soft tissue abnormality. No skull fracture. These findings were communicated by  telephone to Dr. Suzanne on 09/05/2024 at 11:20 am. IMPRESSION: 1. Acute subarachnoid hemorrhage concerning for aneurysm rupture. A CTA is recommended for further evaluation. 2. Small volume intraventricular hemorrhage and mild ventriculomegaly consistent with hydrocephalus. Electronically signed by: Dasie Hamburg MD 09/05/2024 11:21 AM EST RP Workstation: [id number]     Time coordinating discharge: Over 30 minutes    Alm Apo, MD  [facility] 09/19/2024, 12:32 PM

## 2024-09-19 NOTE — TOC Transition Note (Signed)
 Transition of Care (TOC) - Discharge Note Rayfield Gobble RN,BSN Inpatient Care Management Unit 4NP (Non Trauma)- RN Case Manager See Treatment Team for direct Phone #   Patient Details  Name: Clinton Phillips MRN: [id number] Date of Birth: [birth year]  Transition of Care [facility]) CM/SW Contact:  Gobble Rayfield Hurst, RN Phone Number: 09/19/2024, 12:06 PM   Clinical Narrative:    Pt stable for transition home today, Orders placed for DME- RW, per attending MD verbal order given for outpt PT/OT referral.   CM spoke with wife at bedside (pt having vascular test done), wife requesting referral for outpt therapy be sent to [facility], referral sent via Epic Hub to [facility] for PT/OT- ref# [id number] Wife also voiced that pt has a short RW at home that is old- needs taller one, no preference for DME provider- CM will refer to [employer] for RW delivery prior to discharge.  PCP f/u appointment made for Jan. 30- info on AVS- explained to wife that PCP f/u has been made for [facility] and info on AVS. Wife voiced understanding.   Referral for DME placed in Hub to [employer] and msg sent to liaison- referral accepted and DME- RW to be delivered to pt prior to discharge.   TOC pharmacy to fill meds for pick up on discharge, wife will transport home.   IP CM interventions have been completed no further needs noted.   Final next level of care: OP Rehab Barriers to Discharge: No Barriers Identified   Patient Goals and CMS Choice Patient states their goals for this hospitalization and ongoing recovery are:: return home   Choice offered to / list presented to : Spouse      Discharge Placement             home          Discharge Plan and Services Additional resources added to the After Visit Summary for     Discharge Planning Services: CM Consult Post Acute Care Choice: Durable Medical Equipment          DME Arranged: Vannie rolling DME Agency: [facility] Date DME Agency Contacted: 09/19/24 Time DME  Agency Contacted: 1206 Representative spoke with at DME Agency: [facility] HH Arranged: NA HH Agency: NA        Social Drivers of Health (SDOH) Interventions SDOH Screenings   Food Insecurity: No Food Insecurity (09/08/2024)  Housing: Low Risk  (09/08/2024)  Transportation Needs: No Transportation Needs (09/08/2024)  Utilities: Not At Risk (09/08/2024)  Tobacco Use: High Risk (09/06/2024)     Readmission Risk Interventions    09/19/2024   12:06 PM  Readmission Risk Prevention Plan  Medication Screening Complete  Transportation Screening Complete

## 2024-09-19 NOTE — Progress Notes (Signed)
 ranscranial Doppler   Date POD PCO2 HCT BP   MCA ACA PCA OPHT [facility] VERT Basilar  12/7rs/         Right  Left   84  96   -59  -37   28  37   16  26   48  51   -27  -26   -48       12/8 CK  14        Right  Left    67   71   * *  34   28    13   18     *   *    -20   -23    -41                 Right  Left                                                                 Right  Left                                                                 Right  Left                                                               Right  Left                                                               Right  Left                                                       MCA = Middle Cerebral Artery      OPHT = Opthalmic Artery     BASILAR = Basilar Artery   ACA = Anterior Cerebral Artery     SIPH = Carotid Siphon PCA = Posterior Cerebral Artery   VERT = Verterbral Artery                    Normal MCA = 62+\-12 ACA = 50+\-12 PCA = 42+\-23    RIght Lindegaard Ratio 2.31 Left Lindegaard Ratio 4.73   Results can be found under chart review under CV PROC. 09/19/2024 4:30 PM Suriya Kovarik RVT, RDMS (for [employer])

## 2024-09-19 NOTE — Telephone Encounter (Signed)
 Pharmacy Patient Advocate Encounter  Received notification from [employer] that Prior Authorization for niMODipine  30MG  capsules  has been APPROVED from 09/19/2024 to 09/19/2025   PA #/Case ID/Reference #: [id number] KEY: [id number]

## 2024-09-19 NOTE — Progress Notes (Signed)
 Occupational Therapy Treatment Patient Details Name: Clinton Phillips MRN: [id number] DOB: [birth year] Today's Date: 09/19/2024   History of present illness 63 yo male adm 11/24 with HA, AMS, acute SAH with concern for Acomm rupture and small volume IVH with mild ventriculomegaly c/w hydrocephalus. 11/24 EVD and coil embolization. ETT 11/24-11/25. 12/1 EVD removed. PMHx: none.   OT comments  Pt is making great progress towards their acute OT goals. He is eager to d/c home today. Overall his balance, activity tolerance and ability to complete ADLs has improved, superivsion A provided throughout for safety only. He remains limited by cognition, he often needs cues for problem solving, reasoning, carryover of new information and safety. He was unable to dual task and followed 2 step commands ~50% of the time. OT to continue to follow acutely to facilitate progress towards established goals. Pt will continue to benefit from OP OT.       If plan is discharge home, recommend the following:  A lot of help with walking and/or transfers;A lot of help with bathing/dressing/bathroom;Assistance with cooking/housework;Assist for transportation   Equipment Recommendations  Other (comment)       Precautions / Restrictions Precautions Precautions: Fall Recall of Precautions/Restrictions: Impaired Restrictions Weight Bearing Restrictions Per Provider Order: No       Mobility Bed Mobility     General bed mobility comments: OOB on arrival    Transfers Overall transfer level: Needs assistance Equipment used: None Transfers: Sit to/from Stand Sit to Stand: Supervision                 Balance Overall balance assessment: Mild deficits observed, not formally tested Sitting-balance support: Feet supported Sitting balance-Leahy Scale: Good     Standing balance support: No upper extremity supported, During functional activity                               ADL either performed  or assessed with clinical judgement   ADL Overall ADL's : Needs assistance/impaired Eating/Feeding: Independent                       Toilet Transfer: Supervision/safety;Ambulation           Functional mobility during ADLs: Supervision/safety General ADL Comments: improved tolerance, safety and awareness.    Extremity/Trunk Assessment Upper Extremity Assessment Upper Extremity Assessment: Overall WFL for tasks assessed   Lower Extremity Assessment Lower Extremity Assessment: Defer to PT evaluation        Vision   Vision Assessment?: Wears glasses for reading   Perception Perception Perception: Within Functional Limits   Praxis Praxis Praxis: WFL   Communication Communication Communication: No apparent difficulties   Cognition Arousal: Alert Behavior During Therapy: WFL for tasks assessed/performed         Following commands: Impaired        Cueing   Cueing Techniques: Verbal cues        General Comments VSS on RA    Pertinent Vitals/ Pain       Pain Assessment Pain Assessment: Faces Faces Pain Scale: No hurt   Frequency  Min 2X/week        Progress Toward Goals  OT Goals(current goals can now be found in the care plan section)  Progress towards OT goals: Progressing toward goals  Acute Rehab OT Goals Patient Stated Goal: to go home OT Goal Formulation: With patient Time For Goal Achievement: 09/20/24 Potential to Achieve Goals: Good  ADL Goals Pt Will Perform Grooming: with supervision;standing Pt Will Perform Upper Body Dressing: with supervision Pt Will Perform Lower Body Dressing: with supervision;sit to/from stand Pt Will Transfer to Toilet: with supervision;ambulating Additional ADL Goal #1: Pt will indep sequence through a basic ADL task   AM-PAC OT 6 Clicks Daily Activity     Outcome Measure   Help from another person eating meals?: None Help from another person taking care of personal grooming?: A Little Help  from another person toileting, which includes using toliet, bedpan, or urinal?: A Little Help from another person bathing (including washing, rinsing, drying)?: A Little Help from another person to put on and taking off regular upper body clothing?: A Little Help from another person to put on and taking off regular lower body clothing?: A Little 6 Click Score: 19    End of Session Equipment Utilized During Treatment: Gait belt  OT Visit Diagnosis: Unsteadiness on feet (R26.81);Other abnormalities of gait and mobility (R26.89);Muscle weakness (generalized) (M62.81);Pain   Activity Tolerance Patient tolerated treatment well   Patient Left in chair;with call bell/phone within reach;with chair alarm set;with family/visitor present   Nurse Communication Mobility status        Time: 8980-8964 OT Time Calculation (min): 16 min  Charges: OT General Charges $OT Visit: 1 Visit OT Treatments $Self Care/Home Management : 8-22 mins  Lucie Kendall, OTR/L Acute Rehabilitation Services Office [phone] Secure Chat Communication Preferred   [name] 09/19/2024, 11:56 AM

## 2024-09-19 NOTE — Progress Notes (Signed)
 Discharge instructions given to patient and patient's wife. IV removed by patient. Patient's property accounted for and returned to patient. Sutures on patient's scalp removed by NP. Patient transported off unit in wheelchair by NT.

## 2024-09-21 ENCOUNTER — Telehealth: Payer: Self-pay

## 2024-09-21 NOTE — Progress Notes (Signed)
..  Complex Care Management   09/21/2024  Name: Clinton Phillips  MRN: [id number]  DOB: 1960-11-12  Inbound call received from [place] after receiving an Interactive Voice Response (IVR) call after a recent hospitalization. Information was provided about Care Management services.  Follow up plan: No further follow up required    Leita Lyme, BLANCH CAULK [facility]  [facility], [facility] Care Management Assistant [phone]

## 2024-09-27 ENCOUNTER — Encounter (HOSPITAL_COMMUNITY): Payer: Self-pay | Admitting: Neuroradiology

## 2024-10-19 ENCOUNTER — Ambulatory Visit: Admitting: Neuroradiology

## 2024-10-19 ENCOUNTER — Encounter: Payer: Self-pay | Admitting: Neuroradiology

## 2024-10-19 VITALS — BP 124/85 | HR 85 | Temp 98.0°F | Ht 73.5 in | Wt 215.2 lb

## 2024-10-19 DIAGNOSIS — I671 Cerebral aneurysm, nonruptured: Secondary | ICD-10-CM

## 2024-10-19 NOTE — Progress Notes (Signed)
 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  He has made a uneventful recovery and is back to normal.  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  We talked about the natural history of unruptured aneurysms.  I have explained that small incidentally detected aneurysms may have a very low risk of bleeding.  In his case, given the bleeding of the other aneurysm, the risk might be slightly higher, but I would still estimate the risk no more than 1 %/year, and possibly less.  I have explained that a endovascular coiling of the aneurysm would have a risk around 2 to 3% for a complication such as a stroke, or the worst case, rupture of the aneurysm.  I think it is reasonable to continue observation, or to proceed with treatment if he is particularly worried about the aneurysm (given his previous subarachnoid hemorrhage).  He needs a follow-up arteriogram at 6 months, which would be in May.  My plan is to meet with him in the middle of May to planned the follow-up arteriogram and rediscuss the second aneurysm.  If he is inclined towards treatment, we can arrange that at the same time.  I did offer to proceed with treatment of the aneurysm sooner if he was particularly concerned about it, but he does not seem to be.

## 2024-10-20 ENCOUNTER — Telehealth: Payer: Self-pay | Admitting: Neuroradiology

## 2024-10-20 NOTE — Telephone Encounter (Signed)
 Left VM for patient to call back and schedule follow up Mid- May to review plan of care and follow up with Dr. Lester.

## 2024-11-01 NOTE — Addendum Note (Signed)
 Addended by: Emil Klassen on: 11/01/2024 10:39 AM   Modules accepted: Orders

## 2024-11-11 ENCOUNTER — Ambulatory Visit: Admitting: Physician Assistant

## 2024-11-16 ENCOUNTER — Ambulatory Visit: Admitting: Physician Assistant

## 2024-11-16 ENCOUNTER — Encounter: Payer: Self-pay | Admitting: Physician Assistant

## 2024-11-16 VITALS — BP 126/91 | HR 80 | Resp 16 | Ht 73.5 in | Wt 225.2 lb

## 2024-11-16 DIAGNOSIS — I609 Nontraumatic subarachnoid hemorrhage, unspecified: Secondary | ICD-10-CM | POA: Diagnosis not present

## 2024-11-16 DIAGNOSIS — Z1159 Encounter for screening for other viral diseases: Secondary | ICD-10-CM | POA: Diagnosis not present

## 2024-11-16 DIAGNOSIS — E871 Hypo-osmolality and hyponatremia: Secondary | ICD-10-CM

## 2024-11-16 DIAGNOSIS — Z1211 Encounter for screening for malignant neoplasm of colon: Secondary | ICD-10-CM | POA: Diagnosis not present

## 2024-11-16 DIAGNOSIS — I10 Essential (primary) hypertension: Secondary | ICD-10-CM

## 2024-11-16 DIAGNOSIS — Z09 Encounter for follow-up examination after completed treatment for conditions other than malignant neoplasm: Secondary | ICD-10-CM

## 2024-11-16 DIAGNOSIS — Z7689 Persons encountering health services in other specified circumstances: Secondary | ICD-10-CM | POA: Diagnosis not present

## 2024-11-16 DIAGNOSIS — Z720 Tobacco use: Secondary | ICD-10-CM | POA: Insufficient documentation

## 2024-11-16 DIAGNOSIS — G911 Obstructive hydrocephalus: Secondary | ICD-10-CM | POA: Diagnosis not present

## 2024-11-16 NOTE — Progress Notes (Unsigned)
 " New patient visit  Patient: Clinton Phillips   DOB: 01-18-1961   64 y.o. Male  MRN: [id number] Visit Date: 11/16/2024  Today's healthcare provider: Jolynn Spencer, PA-C   Chief Complaint  Patient presents with   Establish Care    New patient establishing care PCP: Carlin Blamer Coloscopy: never-he would like the cologuard. Reports no colon cancer in family. Declined all vaccine.   Hospitalization Follow-up    Admit date: 09/05/2024 Discharge date: 09/19/2024 Principal Diagnosis: SAH (subarachnoid hemorrhage)   Subjective    Clinton Phillips is a 64 y.o. male who presents today as a new patient to establish care.   HPI     Establish Care    Additional comments: New patient establishing care PCP: Carlin Blamer Coloscopy: never-he would like the cologuard. Reports no colon cancer in family. Declined all vaccine.        Hospitalization Follow-up    Additional comments: Admit date: 09/05/2024 Discharge date: 09/19/2024 Principal Diagnosis: SAH (subarachnoid hemorrhage)      Last edited by Rosas, Joseline E, CMA on 11/16/2024  3:14 PM.      Discussed the use of AI scribe software for clinical note transcription with the patient, who gave verbal consent to proceed.  History of Present Illness Clinton Phillips is a 64 year old male who presents for follow-up after hospital discharge with concerns about hypertension management. He is accompanied by his spouse, Corean.  He was discharged from the hospital on December 8th after being diagnosed with hypertension. During his hospital stay he was prescribed blood pressure medication, which he completed. He is currently not taking any blood pressure medication because he did not receive a prescription at discharge. He has no prior hypertension and has not had headaches, dizziness, chest pain, shortness of breath, or palpitations.  He feels well and is eager to return to work.  His appetite increased after leaving the hospital and  his weight is about 225 pounds, which is normal for him.  He smoked for about 25 years, about a pack every two days, and has recently quit. He does not currently drink alcohol, though he has wine at home. He has not been exercising regularly.    Past Medical History:  Diagnosis Date   Anemia    Past Surgical History:  Procedure Laterality Date   IR 3D INDEPENDENT WKST  09/05/2024   IR ANGIO INTRA EXTRACRAN SEL COM CAROTID INNOMINATE UNI L MOD SED  09/05/2024   IR ANGIOGRAM FOLLOW UP STUDY  09/05/2024   IR ANGIOGRAM FOLLOW UP STUDY  09/05/2024   IR ANGIOGRAM FOLLOW UP STUDY  09/05/2024   IR CT HEAD LTD  09/05/2024   IR TRANSCATH/EMBOLIZ  09/05/2024   IR US  GUIDE VASC ACCESS RIGHT  09/05/2024   RADIOLOGY WITH ANESTHESIA N/A 09/05/2024   Procedure: RADIOLOGY WITH ANESTHESIA;  Surgeon: Radiologist, Medication, MD;  Location: [facility] OR;  Service: Radiology;  Laterality: N/A;   Family Status  Relation Name Status   Mother  Deceased   Father  Deceased  No partnership data on file   History reviewed. No pertinent family history. Social History   Socioeconomic History   Marital status: Single    Spouse name: Not on file   Number of children: Not on file   Years of education: Not on file   Highest education level: Not on file  Occupational History   Not on file  Tobacco Use   Smoking status: Former    Average packs/day: 0.5 packs/day for 50.0  years (25.0 ttl pk-yrs)    Types: Cigarettes    Start date: 1976   Smokeless tobacco: Never  Vaping Use   Vaping status: Never Used  Substance and Sexual Activity   Alcohol use: Yes    Alcohol/week: 12.0 standard drinks of alcohol    Types: 12 Cans of beer per week    Comment: weekly   Drug use: Yes    Types: Marijuana   Sexual activity: Yes    Birth control/protection: Condom  Other Topics Concern   Not on file  Social History Narrative   Not on file   Social Drivers of Health   Tobacco Use: Medium Risk (11/16/2024)   Patient  History    Smoking Tobacco Use: Former    Smokeless Tobacco Use: Never    Passive Exposure: Not on Actuary Strain: Not on file  Food Insecurity: No Food Insecurity (09/08/2024)   Epic    Worried About Programme Researcher, Broadcasting/film/video in the Last Year: Never true    Ran Out of Food in the Last Year: Never true  Transportation Needs: No Transportation Needs (09/08/2024)   Epic    Lack of Transportation (Medical): No    Lack of Transportation (Non-Medical): No  Physical Activity: Not on file  Stress: Not on file  Social Connections: Not on file  Depression (PHQ2-9): Low Risk (11/16/2024)   Depression (PHQ2-9)    PHQ-2 Score: 0  Alcohol Screen: Not on file  Housing: Low Risk (09/08/2024)   Epic    Unable to Pay for Housing in the Last Year: No    Number of Times Moved in the Last Year: 0    Homeless in the Last Year: No  Utilities: Not At Risk (09/08/2024)   Epic    Threatened with loss of utilities: No  Health Literacy: Not on file   Show/hide medication list[1] Allergies[2]  Immunization History  Administered Date(s) Administered   Hepatitis A 08/05/2004   Hepatitis B 08/05/2004    Health Maintenance  Topic Date Due   DTaP/Tdap/Td (1 - Tdap) Never done   Hepatitis B Vaccines 19-59 Average Risk (2 of 3 - 19+ 3-dose series) 09/02/2004   Colonoscopy  Never done   Lung Cancer Screening  Never done   Pneumococcal Vaccine: 50+ Years (1 of 1 - PCV) Never done   Zoster Vaccines- Shingrix (1 of 2) Never done   COVID-19 Vaccine (1 - 2025-26 season) Never done   Influenza Vaccine  01/10/2025 (Originally 05/13/2024)   HPV VACCINES (No Doses Required) Completed   Hepatitis C Screening  Completed   HIV Screening  Completed   Meningococcal B Vaccine  Aged Out    Patient Care Team: Makinzey Banes, PA-C as PCP - General (Physician Assistant)  Review of Systems Except see HPI   {Insert previous labs (optional):23779} {See past labs  Heme  Chem  Endocrine  Serology   Results Review (optional):1}   Objective    BP (!) 126/91 (BP Location: Right Arm, Patient Position: Sitting, Cuff Size: Large)   Pulse 80   Resp 16   Ht 6' 1.5 (1.867 m)   Wt 225 lb 3.2 oz (102.2 kg)   SpO2 100%   BMI 29.31 kg/m  {Insert last BP/Wt (optional):23777}{See vitals history (optional):1}   Physical Exam Vitals reviewed.  Constitutional:      General: He is not in acute distress.    Appearance: Normal appearance. He is not diaphoretic.  HENT:     Head: Normocephalic  and atraumatic.  Eyes:     General: No scleral icterus.    Conjunctiva/sclera: Conjunctivae normal.  Cardiovascular:     Rate and Rhythm: Normal rate and regular rhythm.     Pulses: Normal pulses.     Heart sounds: Normal heart sounds. No murmur heard. Pulmonary:     Effort: Pulmonary effort is normal. No respiratory distress.     Breath sounds: Normal breath sounds. No wheezing or rhonchi.  Musculoskeletal:     Cervical back: Neck supple.     Right lower leg: No edema.     Left lower leg: No edema.  Lymphadenopathy:     Cervical: No cervical adenopathy.  Skin:    General: Skin is warm and dry.     Findings: No rash.  Neurological:     Mental Status: He is alert and oriented to person, place, and time. Mental status is at baseline.  Psychiatric:        Mood and Affect: Mood normal.        Behavior: Behavior normal.     Depression Screen    11/16/2024    3:06 PM  PHQ 2/9 Scores  PHQ - 2 Score 0   No results found for any visits on 11/16/24.  Assessment & Plan    Assessment & Plan Subarachnoid hemorrhage, status post hospitalization Status post hospitalization for subarachnoid hemorrhage. No current symptoms reported. Follow-up with neurosurgeon planned for July for repeat imaging and discussion. - Continue follow-up with neurosurgeon in July for repeat imaging and discussion.  Serum troponin to evaluate for cardiac stress.[1]  Frequent monitoring of sodium levels due to the risk of  cerebral salt wasting and hyponatremia.[1]  Lumbar puncture (LP) to examine cerebrospinal fluid (CSF) for cell counts, xanthochromia, and elevated opening pressure if CT scan is negative but suspicion remains high.[3-4]  Serum uric acid (SUA) levels, as elevated SUA is associated with shunt-dependent hydrocephalus in aneurysmal SAH patients.[  Hypertension Diagnosed during hospitalization. Blood pressure slightly elevated. No symptoms reported while on medication. - Obtain a blood pressure cuff and measure blood pressure morning and evening. - Bring blood pressure readings to next appointment. - Consider referral to clinic pharmacist for blood pressure management. - Scheduled follow-up in two weeks to review blood pressure readings. Will follow-up  Overweight Chronic and unstable BMI is 29.31, indicating overweight status. Weight gain noted post-hospitalization. - Encouraged regular exercise, aiming for one hour daily. - Discussed weight management strategies. Will follow-up  Tobacco use History of smoking, recently quit. Discussed lung cancer screening due to smoking history and potential asbestos exposure. - Referred for lung cancer screening. 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  General Health Maintenance Discussed vaccinations and screenings. Lung cancer screening recommended due to smoking history. - Consider tetanus, pneumonia, and shingles vaccinations. - Will schedule lung cancer screening.  Hospital discharge follow-up (Primary) Was d/c on 09/18/24 - Comprehensive metabolic panel with GFR - CBC with Differential/Platelet - Lipid panel  SAH (subarachnoid hemorrhage) (HCC) Obstructive hydrocephalus (HCC)  Hyponatremia  - Comprehensive metabolic panel with GFR - CBC with Differential/Platelet - Lipid panel  Hypertension, unspecified type  - Comprehensive metabolic panel with GFR - CBC with  Differential/Platelet - Lipid panel   Screen for colon cancer  - Cologuard  Need for hepatitis B screening test  - Hepatitis B surface antibody,quantitative  Tobacco abuse  - Ambulatory Referral for Lung Cancer Scre   Encounter to establish care Welcomed to our clinic Reviewed  past medical hx, social hx, family hx and surgical hx Pt advised to send all vaccination records or screening   No follow-ups on file.    The patient was advised to call back or seek an in-person evaluation if the symptoms worsen or if the condition fails to improve as anticipated.  I discussed the assessment and treatment plan with the patient. The patient was provided an opportunity to ask questions and all were answered. The patient agreed with the plan and demonstrated an understanding of the instructions.  I, Sheri Prows, PA-C have reviewed all documentation for this visit. The documentation on  11/16/2024   for the exam, diagnosis, procedures, and orders are all accurate and complete.  Jolynn Spencer, [facility], MMS [facility] [phone] (phone) [phone] (fax)  [facility]     [1]  Outpatient Medications Prior to Visit  Medication Sig   losartan  (COZAAR ) 50 MG tablet Take 1 tablet (50 mg total) by mouth daily.   niMODipine  (NIMOTOP ) 30 MG capsule Take 2 capsules (60 mg total) by mouth every 4 (four) hours for 7 days.   oxyCODONE  (OXY IR/ROXICODONE ) 5 MG immediate release tablet Take 1 tablet (5 mg total) by mouth every 6 (six) hours as needed for moderate pain (pain score 4-6), severe pain (pain score 7-10) or breakthrough pain.   No facility-administered medications prior to visit.  [2] No Known Allergies  "

## 2024-11-17 MED ORDER — AMLODIPINE BESYLATE 2.5 MG PO TABS: 2.5000 mg | ORAL_TABLET | Freq: Every day | ORAL | 0 refills | Status: DC

## 2024-11-18 LAB — COMPREHENSIVE METABOLIC PANEL WITH GFR
ALT: 68 [IU]/L — ABNORMAL HIGH (ref 0–44)
AST: 50 [IU]/L — ABNORMAL HIGH (ref 0–40)
Albumin: 4.2 g/dL (ref 3.9–4.9)
Alkaline Phosphatase: 78 [IU]/L (ref 47–123)
BUN/Creatinine Ratio: 14 (ref 10–24)
BUN: 18 mg/dL (ref 8–27)
Bilirubin Total: 0.4 mg/dL (ref 0.0–1.2)
CO2: 25 mmol/L (ref 20–29)
Calcium: 9.7 mg/dL (ref 8.6–10.2)
Chloride: 104 mmol/L (ref 96–106)
Creatinine, Ser: 1.25 mg/dL (ref 0.76–1.27)
Globulin, Total: 2.2 g/dL (ref 1.5–4.5)
Glucose: 106 mg/dL — ABNORMAL HIGH (ref 70–99)
Potassium: 4.3 mmol/L (ref 3.5–5.2)
Sodium: 141 mmol/L (ref 134–144)
Total Protein: 6.4 g/dL (ref 6.0–8.5)
eGFR: 64 mL/min/{1.73_m2}

## 2024-11-18 LAB — LIPID PANEL
Chol/HDL Ratio: 6.7 ratio — ABNORMAL HIGH (ref 0.0–5.0)
Cholesterol, Total: 267 mg/dL — ABNORMAL HIGH (ref 100–199)
HDL: 40 mg/dL
LDL Chol Calc (NIH): 206 mg/dL — ABNORMAL HIGH (ref 0–99)
Triglycerides: 114 mg/dL (ref 0–149)
VLDL Cholesterol Cal: 21 mg/dL (ref 5–40)

## 2024-11-18 LAB — CBC WITH DIFFERENTIAL/PLATELET
Basophils Absolute: 0 10*3/uL (ref 0.0–0.2)
Basos: 1 %
EOS (ABSOLUTE): 0.2 10*3/uL (ref 0.0–0.4)
Eos: 5 %
Hematocrit: 38.2 % (ref 37.5–51.0)
Hemoglobin: 12.5 g/dL — ABNORMAL LOW (ref 13.0–17.7)
Immature Grans (Abs): 0 10*3/uL (ref 0.0–0.1)
Immature Granulocytes: 0 %
Lymphocytes Absolute: 2.5 10*3/uL (ref 0.7–3.1)
Lymphs: 50 %
MCH: 31.7 pg (ref 26.6–33.0)
MCHC: 32.7 g/dL (ref 31.5–35.7)
MCV: 97 fL (ref 79–97)
Monocytes Absolute: 0.5 10*3/uL (ref 0.1–0.9)
Monocytes: 10 %
Neutrophils Absolute: 1.7 10*3/uL (ref 1.4–7.0)
Neutrophils: 34 %
Platelets: 258 10*3/uL (ref 150–450)
RBC: 3.94 x10E6/uL — ABNORMAL LOW (ref 4.14–5.80)
RDW: 11.7 % (ref 11.6–15.4)
WBC: 5 10*3/uL (ref 3.4–10.8)

## 2024-11-18 LAB — HEPATITIS B SURFACE ANTIBODY, QUANTITATIVE: Hepatitis B Surf Ab Quant: <3.5 m[IU]/mL — ABNORMAL LOW

## 2024-12-01 ENCOUNTER — Ambulatory Visit: Admitting: Physician Assistant

## 2025-02-22 ENCOUNTER — Ambulatory Visit: Admitting: Neuroradiology
